# Patient Record
Sex: Male | Born: 1990
Health system: Southern US, Community
[De-identification: ages and names within clinical notes are randomized; demographics above are authoritative.]

## PROBLEM LIST (undated history)

## (undated) DIAGNOSIS — N319 Neuromuscular dysfunction of bladder, unspecified: Secondary | ICD-10-CM

## (undated) DIAGNOSIS — Q059 Spina bifida, unspecified: Secondary | ICD-10-CM

## (undated) HISTORY — DX: Neuromuscular dysfunction of bladder, unspecified: N31.9

## (undated) HISTORY — DX: Spina bifida, unspecified: Q05.9

---
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
## 2012-04-30 DIAGNOSIS — L03119 Cellulitis of unspecified part of limb: Secondary | ICD-10-CM | POA: Diagnosis not present

## 2012-04-30 DIAGNOSIS — L02419 Cutaneous abscess of limb, unspecified: Secondary | ICD-10-CM | POA: Diagnosis not present

## 2012-04-30 DIAGNOSIS — N390 Urinary tract infection, site not specified: Secondary | ICD-10-CM | POA: Diagnosis not present

## 2012-05-11 DIAGNOSIS — J111 Influenza due to unidentified influenza virus with other respiratory manifestations: Secondary | ICD-10-CM | POA: Diagnosis not present

## 2012-05-11 DIAGNOSIS — L02419 Cutaneous abscess of limb, unspecified: Secondary | ICD-10-CM | POA: Diagnosis not present

## 2012-05-11 DIAGNOSIS — L03119 Cellulitis of unspecified part of limb: Secondary | ICD-10-CM | POA: Diagnosis not present

## 2012-09-12 DIAGNOSIS — W57XXXA Bitten or stung by nonvenomous insect and other nonvenomous arthropods, initial encounter: Secondary | ICD-10-CM | POA: Diagnosis not present

## 2012-09-12 DIAGNOSIS — L02419 Cutaneous abscess of limb, unspecified: Secondary | ICD-10-CM | POA: Diagnosis not present

## 2012-09-12 DIAGNOSIS — L03119 Cellulitis of unspecified part of limb: Secondary | ICD-10-CM | POA: Diagnosis not present

## 2012-09-12 DIAGNOSIS — T148 Other injury of unspecified body region: Secondary | ICD-10-CM | POA: Diagnosis not present

## 2012-10-31 DIAGNOSIS — N390 Urinary tract infection, site not specified: Secondary | ICD-10-CM | POA: Diagnosis not present

## 2012-10-31 DIAGNOSIS — L03119 Cellulitis of unspecified part of limb: Secondary | ICD-10-CM | POA: Diagnosis not present

## 2012-10-31 DIAGNOSIS — L02419 Cutaneous abscess of limb, unspecified: Secondary | ICD-10-CM | POA: Diagnosis not present

## 2013-01-02 DIAGNOSIS — L723 Sebaceous cyst: Secondary | ICD-10-CM | POA: Diagnosis not present

## 2013-01-02 DIAGNOSIS — L02419 Cutaneous abscess of limb, unspecified: Secondary | ICD-10-CM | POA: Diagnosis not present

## 2013-01-15 DIAGNOSIS — N5089 Other specified disorders of the male genital organs: Secondary | ICD-10-CM | POA: Insufficient documentation

## 2013-01-15 DIAGNOSIS — N508 Other specified disorders of male genital organs: Secondary | ICD-10-CM | POA: Diagnosis not present

## 2013-02-28 DIAGNOSIS — Q068 Other specified congenital malformations of spinal cord: Secondary | ICD-10-CM | POA: Diagnosis not present

## 2013-02-28 DIAGNOSIS — Q7649 Other congenital malformations of spine, not associated with scoliosis: Secondary | ICD-10-CM | POA: Diagnosis not present

## 2013-03-19 DIAGNOSIS — L02419 Cutaneous abscess of limb, unspecified: Secondary | ICD-10-CM | POA: Diagnosis not present

## 2013-03-19 DIAGNOSIS — N390 Urinary tract infection, site not specified: Secondary | ICD-10-CM | POA: Diagnosis not present

## 2013-04-11 DIAGNOSIS — L02419 Cutaneous abscess of limb, unspecified: Secondary | ICD-10-CM | POA: Diagnosis not present

## 2013-04-11 DIAGNOSIS — N390 Urinary tract infection, site not specified: Secondary | ICD-10-CM | POA: Diagnosis not present

## 2013-07-02 DIAGNOSIS — N390 Urinary tract infection, site not specified: Secondary | ICD-10-CM | POA: Diagnosis not present

## 2013-07-02 DIAGNOSIS — L03119 Cellulitis of unspecified part of limb: Secondary | ICD-10-CM | POA: Diagnosis not present

## 2013-07-02 DIAGNOSIS — L02419 Cutaneous abscess of limb, unspecified: Secondary | ICD-10-CM | POA: Diagnosis not present

## 2013-10-24 DIAGNOSIS — L723 Sebaceous cyst: Secondary | ICD-10-CM | POA: Diagnosis not present

## 2013-10-24 DIAGNOSIS — L02419 Cutaneous abscess of limb, unspecified: Secondary | ICD-10-CM | POA: Diagnosis not present

## 2014-05-19 DIAGNOSIS — Z029 Encounter for administrative examinations, unspecified: Secondary | ICD-10-CM | POA: Diagnosis not present

## 2014-07-04 DIAGNOSIS — S90512A Abrasion, left ankle, initial encounter: Secondary | ICD-10-CM | POA: Diagnosis not present

## 2014-07-04 DIAGNOSIS — N3000 Acute cystitis without hematuria: Secondary | ICD-10-CM | POA: Diagnosis not present

## 2015-07-20 ENCOUNTER — Encounter: Payer: Self-pay | Admitting: Family Medicine

## 2015-07-20 ENCOUNTER — Ambulatory Visit (INDEPENDENT_AMBULATORY_CARE_PROVIDER_SITE_OTHER): Payer: Medicare Other | Admitting: Family Medicine

## 2015-07-20 VITALS — BP 116/68 | HR 84 | Temp 98.5°F | Resp 16 | Wt 127.2 lb

## 2015-07-20 DIAGNOSIS — Q7649 Other congenital malformations of spine, not associated with scoliosis: Secondary | ICD-10-CM | POA: Insufficient documentation

## 2015-07-20 DIAGNOSIS — Q6600 Congenital talipes equinovarus, unspecified foot: Secondary | ICD-10-CM

## 2015-07-20 DIAGNOSIS — Q059 Spina bifida, unspecified: Secondary | ICD-10-CM | POA: Insufficient documentation

## 2015-07-20 DIAGNOSIS — Z0289 Encounter for other administrative examinations: Secondary | ICD-10-CM | POA: Diagnosis not present

## 2015-07-20 DIAGNOSIS — N319 Neuromuscular dysfunction of bladder, unspecified: Secondary | ICD-10-CM | POA: Insufficient documentation

## 2015-07-20 DIAGNOSIS — Q760 Spina bifida occulta: Secondary | ICD-10-CM | POA: Insufficient documentation

## 2015-07-20 NOTE — Patient Instructions (Signed)
Let me know if we missed anything.

## 2015-07-20 NOTE — Progress Notes (Signed)
Patient ID: Jason Marshall, male   DOB: 1990/08/27, 25 y.o.   MRN: [id number] Here for completion of  DMV form to continue driving. He has spina bifida and requires hand controls to drive. Form completed and scanned into the EMR.

## 2015-07-20 NOTE — Progress Notes (Deleted)
Subjective:     Patient ID: Jason Marshall, male   DOB: Oct 14, 1990, 25 y.o.   MRN: [id number]  HPI   Review of Systems     Objective:   Physical Exam     Assessment:     ***    Plan:     ***

## 2015-10-23 ENCOUNTER — Ambulatory Visit (INDEPENDENT_AMBULATORY_CARE_PROVIDER_SITE_OTHER): Payer: Medicare Other | Admitting: Family Medicine

## 2015-10-23 ENCOUNTER — Other Ambulatory Visit: Payer: Self-pay | Admitting: Family Medicine

## 2015-10-23 ENCOUNTER — Encounter: Payer: Self-pay | Admitting: Family Medicine

## 2015-10-23 VITALS — BP 108/62 | HR 88 | Temp 98.0°F | Resp 16 | Wt 133.0 lb

## 2015-10-23 DIAGNOSIS — R300 Dysuria: Secondary | ICD-10-CM | POA: Diagnosis not present

## 2015-10-23 DIAGNOSIS — Z87448 Personal history of other diseases of urinary system: Secondary | ICD-10-CM | POA: Insufficient documentation

## 2015-10-23 DIAGNOSIS — L729 Follicular cyst of the skin and subcutaneous tissue, unspecified: Secondary | ICD-10-CM | POA: Diagnosis not present

## 2015-10-23 DIAGNOSIS — N3090 Cystitis, unspecified without hematuria: Secondary | ICD-10-CM | POA: Diagnosis not present

## 2015-10-23 DIAGNOSIS — Z87718 Personal history of other specified (corrected) congenital malformations of genitourinary system: Secondary | ICD-10-CM | POA: Insufficient documentation

## 2015-10-23 LAB — POCT URINALYSIS DIPSTICK
BILIRUBIN UA: NEGATIVE
Blood, UA: NEGATIVE
GLUCOSE UA: NEGATIVE
Ketones, UA: NEGATIVE
NITRITE UA: POSITIVE
Protein, UA: NEGATIVE
SPEC GRAV UA: 1.01
UROBILINOGEN UA: 0.2
pH, UA: 5

## 2015-10-23 MED ORDER — DOXYCYCLINE HYCLATE 100 MG PO TABS: 100.0000 mg | ORAL_TABLET | Freq: Two times a day (BID) | ORAL | Status: DC

## 2015-10-23 NOTE — Patient Instructions (Signed)
We will call you with the culture results 

## 2015-10-23 NOTE — Progress Notes (Signed)
Subjective:     Patient ID: Jason Marshall, male   DOB: 09-07-1990, 25 y.o.   MRN: [id number]  HPI  Chief Complaint  Patient presents with  . Dysuria    and testicular (right) swelling for 4 days   States he first felt urinary discomfort two days ago. Also reports that he has had a scrotal cyst and that felt tender at first but is no longer. Denies sexually active.   Review of Systems     Objective:   Physical Exam  Constitutional: He appears well-developed and well-nourished. No distress.  Genitourinary:  Right testicle appears atrophied with mobile 1 cm cyst which is non-tender. Prosthetic device descends into his right scrotum.       Assessment:    1. Dysuria - POCT urinalysis dipstick  2. Cystitis - Urine culture - doxycycline (VIBRA-TABS) 100 MG tablet; Take 1 tablet (100 mg total) by mouth 2 (two) times daily.  Dispense: 14 tablet; Refill: 0  3. Scrotal cyst: not inflamed    Plan:    Further f/u pending urine culture. Consider cyst excision if recurrent inflammation.

## 2015-10-26 LAB — URINE CULTURE

## 2015-10-26 LAB — PLEASE NOTE

## 2015-11-06 ENCOUNTER — Other Ambulatory Visit: Payer: Self-pay | Admitting: Family Medicine

## 2015-11-06 ENCOUNTER — Encounter: Payer: Self-pay | Admitting: Family Medicine

## 2015-11-06 ENCOUNTER — Ambulatory Visit (INDEPENDENT_AMBULATORY_CARE_PROVIDER_SITE_OTHER): Payer: Medicare Other | Admitting: Family Medicine

## 2015-11-06 VITALS — BP 112/62 | HR 96 | Temp 99.4°F | Resp 16

## 2015-11-06 DIAGNOSIS — B9620 Unspecified Escherichia coli [E. coli] as the cause of diseases classified elsewhere: Secondary | ICD-10-CM

## 2015-11-06 DIAGNOSIS — N390 Urinary tract infection, site not specified: Secondary | ICD-10-CM

## 2015-11-06 DIAGNOSIS — N3090 Cystitis, unspecified without hematuria: Secondary | ICD-10-CM | POA: Diagnosis not present

## 2015-11-06 LAB — POCT URINALYSIS DIPSTICK
GLUCOSE UA: NEGATIVE
Ketones, UA: NEGATIVE
Nitrite, UA: POSITIVE
SPEC GRAV UA: 1.01
Urobilinogen, UA: 0.2
pH, UA: 6.5

## 2015-11-06 MED ORDER — CIPROFLOXACIN HCL 500 MG PO TABS: 500.0000 mg | ORAL_TABLET | Freq: Two times a day (BID) | ORAL | Status: DC

## 2015-11-06 NOTE — Patient Instructions (Signed)
We will call you with the culture report. 

## 2015-11-06 NOTE — Progress Notes (Signed)
Subjective:     Patient ID: Jason Marshall, male   DOB: [birth year], 25 y.o.   MRN: [id number]  HPI  Chief Complaint  Patient presents with  . Urinary Tract Infection    Was seen on 10/23/2015 for cystitis. Urine cx was positve for E. Coli. Pt finished Doxy. Urinary sx have improved, pt is c/o fever, body aches, H/A, black pain, frequent urination, discolored urine. Denies URI sx.  States he felt ok until he stopped the antibiotic. Prior urine culture reported E. Coli sensitive to all abx.   Review of Systems     Objective:   Physical Exam  Constitutional: He appears well-developed and well-nourished. No distress.  Genitourinary:  No CVA tenderness       Assessment:    1. E. coli UTI - POCT urinalysis dipstick  2. Cystitis - Urine culture - ciprofloxacin (CIPRO) 500 MG tablet; Take 1 tablet (500 mg total) by mouth 2 (two) times daily.  Dispense: 14 tablet; Refill: 0    Plan:    Further f/u pending urine culture.

## 2015-11-08 LAB — URINE CULTURE

## 2015-11-10 ENCOUNTER — Telehealth: Payer: Self-pay

## 2015-11-10 NOTE — Telephone Encounter (Signed)
LMTCB. sd  

## 2015-11-10 NOTE — Telephone Encounter (Signed)
LMTCB

## 2015-11-10 NOTE — Telephone Encounter (Signed)
-----   Message from Anola Gurneyobert Chauvin, [place] sent at 11/10/2015  7:31 AM EDT ----- Continue Cipro for an E.Coli infection

## 2015-11-16 NOTE — Telephone Encounter (Signed)
Patient advised as below.  

## 2016-01-15 ENCOUNTER — Ambulatory Visit
Admission: EM | Admit: 2016-01-15 | Discharge: 2016-01-15 | Disposition: A | Discharge status: Home / Self Care | Payer: Medicare Other | Attending: Emergency Medicine | Admitting: Emergency Medicine

## 2016-01-15 ENCOUNTER — Encounter: Payer: Self-pay | Admitting: *Deleted

## 2016-01-15 DIAGNOSIS — H579 Unspecified disorder of eye and adnexa: Secondary | ICD-10-CM

## 2016-01-15 DIAGNOSIS — H40003 Preglaucoma, unspecified, bilateral: Secondary | ICD-10-CM | POA: Diagnosis not present

## 2016-01-15 DIAGNOSIS — H52223 Regular astigmatism, bilateral: Secondary | ICD-10-CM | POA: Diagnosis not present

## 2016-01-15 DIAGNOSIS — H5203 Hypermetropia, bilateral: Secondary | ICD-10-CM | POA: Diagnosis not present

## 2016-01-15 MED ORDER — KETOTIFEN FUMARATE 0.025 % OP SOLN: 1.0000 [drp] | Freq: Two times a day (BID) | OPHTHALMIC | 0 refills | Status: DC

## 2016-01-15 NOTE — ED Provider Notes (Signed)
CSN: [id number]     Arrival date & time 01/15/16  1134 History   First MD Initiated Contact with Patient 01/15/16 1206     Chief Complaint  Patient presents with  . Eye Pain  . Photophobia  . Blurred Vision   (Consider location/radiation/quality/duration/timing/severity/associated sxs/prior Treatment) HPI  This a 25 year old male presents with a two-week history of waxing and waning bi lateral eye pressure. He does not wear contacts or glasses. He has some photosensitivity no feeling of a foreign body. He does not describe pain but more pressure in his eye. Last night he had more pressure had in the past and has had some blurring of his vision. He denies any discharge or redness. He has a history of a spinal bifida. The patient has an appointment on Monday with [facility] but because of increased pressure and visual disturbances which have since corrected and he came to be seen today.       Past Medical History:  Diagnosis Date  . Neurogenic bladder    with artificial urinary sphincter  . Spina bifida (HCC)    myelodysplasia/sacral agenisis/tethered cord with multiple releases   Past Surgical History:  Procedure Laterality Date  . AMPUTATION  06/11/2009   left fifth toe, metatarsal resection/amputation for osteomylitis  . ANTERIOR AND POSTERIOR SPINAL FUSION  1994   anterior L 3-4 vertebrectomies, anterior and posterior spinal fusion L2-L5, tethered cord release   . artificial urinary sphincter  2010  . BLADDER AUGMENTATION  1995  . Orchidopexy Right 1995   right testicular biopsy  . OSTEOTOMY  1997   right posterior medial release, right foot cuboid closting wedge osteotomy, z-plasty left foot, external rotational osteotomy, left distal tob/fib osteotomy  . TALECTOMY  2001   fibular osteotomy, ilizarov application, excision of distal fibula   Family History  Problem Relation Age of Onset  . Spina bifida Mother    Social History  Substance Use Topics  . Smoking status:  Never Smoker  . Smokeless tobacco: Current User    Types: Snuff  . Alcohol use 0.0 oz/week     Comment: rare    Review of Systems  Constitutional: Negative for activity change, chills, fatigue and fever.  Eyes: Positive for photophobia, pain and visual disturbance.  All other systems reviewed and are negative.   Allergies  Meperidine; Piperacillin-tazobactam in dex; Vancomycin; and Latex  Home Medications   Prior to Admission medications   Medication Sig Start Date End Date Taking? Authorizing Provider  ciprofloxacin (CIPRO) 500 MG tablet Take 1 tablet (500 mg total) by mouth 2 (two) times daily. 11/06/15   Anola Gurney, PA  ketotifen (ZADITOR) 0.025 % ophthalmic solution Place 1 drop into both eyes 2 (two) times daily. 01/15/16   Lutricia Feil, PA-C   Meds Ordered and Administered this Visit  Medications - No data to display  BP 140/84 (BP Location: Left Arm)   Pulse 70   Temp 98.4 F (36.9 C) (Oral)   Resp 16   Ht 4\' 6"  (1.372 m)   Wt 125 lb (56.7 kg)   SpO2 99%   BMI 30.14 kg/m  No data found.   Physical Exam  Constitutional: He is oriented to person, place, and time. He appears well-developed and well-nourished. No distress.  HENT:  Head: Normocephalic and atraumatic.  Eyes: Conjunctivae and EOM are normal. Pupils are equal, round, and reactive to light. Right eye exhibits no discharge. Left eye exhibits no discharge.  Examination of his eyes shows no  erythema present. PERRLA. EOMs are intact and full. No nice tag mass. His upper lids were everted and no findings of no abnormalities or foreign bodies.  Neck: Normal range of motion. Neck supple.  Musculoskeletal:  Patient uses crutches for ambulation from his spinal bifida  Neurological: He is alert and oriented to person, place, and time.  Skin: Skin is warm and dry. He is not diaphoretic.  Psychiatric: He has a normal mood and affect. His behavior is normal. Judgment and thought content normal.  Nursing note  and vitals reviewed.   Urgent Care Course   Clinical Course    Procedures (including critical care time)  Labs Review Labs Reviewed - No data to display  Imaging Review No results found.   Visual Acuity Review  Right Eye Distance: 20/15 Left Eye Distance: 20/15 Bilateral Distance: 20/13  Right Eye Near:   Left Eye Near:    Bilateral Near:         MDM   1. Eye pressure    New Prescriptions   KETOTIFEN (ZADITOR) 0.025 % OPHTHALMIC SOLUTION    Place 1 drop into both eyes 2 (two) times daily.  I told the patient that I don't have adequate instruments to fully examine his eye for pressure. He does not appear to have any emergent abnormality with his eye today and I recommended that he keep his appointment with [facility] on Monday. If he has any change or worsening he should go to the emergency room.    Lutricia FeilWilliam P Shilpa Bushee, PA-C 01/15/16 1300

## 2016-01-15 NOTE — ED Triage Notes (Signed)
Pressure sensation in both eyes, onset 2 weeks ago, intermittent blurred vision in past few days, also pt has photophobia today.

## 2016-03-31 DIAGNOSIS — Q7649 Other congenital malformations of spine, not associated with scoliosis: Secondary | ICD-10-CM | POA: Diagnosis not present

## 2016-03-31 DIAGNOSIS — Q068 Other specified congenital malformations of spinal cord: Secondary | ICD-10-CM | POA: Diagnosis not present

## 2016-04-26 ENCOUNTER — Ambulatory Visit (INDEPENDENT_AMBULATORY_CARE_PROVIDER_SITE_OTHER): Payer: Medicare Other | Admitting: Family Medicine

## 2016-04-26 ENCOUNTER — Ambulatory Visit: Payer: Self-pay | Admitting: Family Medicine

## 2016-04-26 ENCOUNTER — Encounter: Payer: Self-pay | Admitting: Family Medicine

## 2016-04-26 VITALS — BP 126/58 | HR 73 | Temp 98.1°F | Resp 16 | Wt 133.8 lb

## 2016-04-26 DIAGNOSIS — R221 Localized swelling, mass and lump, neck: Secondary | ICD-10-CM | POA: Diagnosis not present

## 2016-04-26 NOTE — Progress Notes (Signed)
Subjective:     Patient ID: Jason Marshall, male   DOB: Jan 21, 1991, 25 y.o.   MRN: [id number]  HPI  Chief Complaint  Patient presents with  . Allergic Reaction    Patient comes in office today wit address possible allergic reaction, patient states that [place] and on 12/22 patient had eaten two bits of shrimp when he reports that he felt swelling of his throat. Patients mother who is accompanied with him today states that they did give patient benadryl. Mother reports that patient has felt this before over the past few months but cannot recall if it was after eating shrimp. Patient states that on 12/25 he felt like he still had feeling  Reports he feels better today. Accompanied by his mother. Previous episodes have felt like a "ball" in his throat but denies significant stress at this time. Mom reports paternal relatives with seafood allergies.   Review of Systems     Objective:   Physical Exam  Constitutional: He appears well-developed and well-nourished. No distress.  HENT:  Mouth/Throat: Oropharynx is clear and moist.  Pulmonary/Chest: Breath sounds normal.  Psychiatric: He has a normal mood and affect. His behavior is normal.       Assessment:    1. Throat swelling - Ambulatory referral to Allergy    Plan:    Discussed keeping Claritin and Benadryl with him pending allergist evaluation.

## 2016-04-26 NOTE — Patient Instructions (Signed)
We will call you with the referral information. 

## 2016-06-01 ENCOUNTER — Encounter: Payer: Self-pay | Admitting: Family Medicine

## 2016-06-01 ENCOUNTER — Ambulatory Visit (INDEPENDENT_AMBULATORY_CARE_PROVIDER_SITE_OTHER): Payer: Medicare Other | Admitting: Family Medicine

## 2016-06-01 VITALS — BP 112/78 | HR 85 | Temp 98.2°F | Resp 16 | Wt 136.0 lb

## 2016-06-01 DIAGNOSIS — N3090 Cystitis, unspecified without hematuria: Secondary | ICD-10-CM | POA: Diagnosis not present

## 2016-06-01 LAB — POCT URINALYSIS DIPSTICK
Glucose, UA: NEGATIVE
KETONES UA: NEGATIVE
Nitrite, UA: POSITIVE
PROTEIN UA: 30
Spec Grav, UA: <=1.005
Urobilinogen, UA: 0.2
pH, UA: 7

## 2016-06-01 MED ORDER — SULFAMETHOXAZOLE-TRIMETHOPRIM 800-160 MG PO TABS: 1.0000 | ORAL_TABLET | Freq: Two times a day (BID) | ORAL | 0 refills | Status: DC

## 2016-06-01 NOTE — Progress Notes (Signed)
Subjective:     Patient ID: Jason Marshall, male   DOB: [birth year], 26 y.o.   MRN: [id number]  HPI  Chief Complaint  Patient presents with  . Dysuria    Patient comes in office today with concerns of urinary tract infection, patient reports dysuria for the past 2 days or more. Patient also states that he has been dealing with fatigue for two weeks and was unsure if this was related.   No fever or chills reported.   Review of Systems  Allergic/Immunologic:       Pending allergist evaluation next month for possible shellfish allergy       Objective:   Physical Exam  Constitutional: He appears well-developed and well-nourished. No distress.       Assessment:    1. Cystitis - Urine culture - POCT urinalysis dipstick - sulfamethoxazole-trimethoprim (BACTRIM DS,SEPTRA DS) 800-160 MG tablet; Take 1 tablet by mouth 2 (two) times daily. May stop after 3 days if better  Dispense: 14 tablet; Refill: 0    Plan:    Further f/u pending culture report.

## 2016-06-01 NOTE — Patient Instructions (Signed)
We will call you with the culture report. 

## 2016-06-03 LAB — URINE CULTURE

## 2016-06-06 ENCOUNTER — Telehealth: Payer: Self-pay

## 2016-06-06 NOTE — Telephone Encounter (Signed)
Advised pt of lab results. Pt verbally acknowledges understanding. Jason Marshall, CMA   

## 2016-06-06 NOTE — Telephone Encounter (Signed)
-----   Message from Anola Gurneyobert Chauvin, [place] sent at 06/06/2016  7:40 AM EST ----- Continue Sulfa antibiotic for  E. Coli infection.

## 2016-06-07 DIAGNOSIS — J301 Allergic rhinitis due to pollen: Secondary | ICD-10-CM | POA: Diagnosis not present

## 2016-06-07 DIAGNOSIS — J3081 Allergic rhinitis due to animal (cat) (dog) hair and dander: Secondary | ICD-10-CM | POA: Diagnosis not present

## 2016-06-07 DIAGNOSIS — J3089 Other allergic rhinitis: Secondary | ICD-10-CM | POA: Diagnosis not present

## 2016-06-07 DIAGNOSIS — Z91018 Allergy to other foods: Secondary | ICD-10-CM | POA: Diagnosis not present

## 2016-06-10 ENCOUNTER — Ambulatory Visit (INDEPENDENT_AMBULATORY_CARE_PROVIDER_SITE_OTHER): Payer: Medicare Other | Admitting: Family Medicine

## 2016-06-10 ENCOUNTER — Encounter: Payer: Self-pay | Admitting: Family Medicine

## 2016-06-10 VITALS — BP 140/62 | HR 88 | Temp 98.4°F | Resp 16 | Wt 139.0 lb

## 2016-06-10 DIAGNOSIS — R42 Dizziness and giddiness: Secondary | ICD-10-CM

## 2016-06-10 DIAGNOSIS — R002 Palpitations: Secondary | ICD-10-CM | POA: Diagnosis not present

## 2016-06-10 DIAGNOSIS — R0602 Shortness of breath: Secondary | ICD-10-CM | POA: Diagnosis not present

## 2016-06-10 LAB — EKG 12-LEAD

## 2016-06-10 NOTE — Progress Notes (Signed)
Subjective:     Patient ID: Jason Marshall, male   DOB: [birth year], 26 y.o.   MRN: [id number]  HPI  Chief Complaint  Patient presents with  . Dizziness    Patient comes in office today with concerns of dizziness for the past 3 weeks. Patient reports episodes of dizziness usually being in AM upon awakening. Patient reports that he feels light headed, experiences shakes and feeling out of breath. Associated with dizziness patient states that he always gets a headache on the top of his head on the right side.   Reports the episodes will last from 1-1.5 hours and occur nearly daily. He states he will stop and rest for a while and they will  abate. Can occur with exertion and occur both before and after lunch. Occasionally will feel his heart may be racing;no vertigo. Usually skips breakfast but has a mid-morning snack. Mom reports that there is a strong family history of hypoglycemic episodes.   Review of Systems     Objective:   Physical Exam  Constitutional: He appears well-developed and well-nourished. No distress.  Eyes: EOM are normal. Pupils are equal, round, and reactive to light.  Cardiovascular: Normal rate and regular rhythm.   Pulmonary/Chest: Breath sounds normal.  Musculoskeletal:  Grip strength 5/5 symmetrically  Neurological: Coordination (finger to nose WNL) normal.       Assessment:    1. Dizziness - Comprehensive metabolic panel - EKG 12-Lead - CBC with Differential/Platelet  2. Shortness of breath - EKG 12-Lead - CBC with Differential/Platelet  3. Palpitations - T4, free - TSH    Plan:    Provided with hypoglycemic food sheet pending lab work. Consider Holter monitor.

## 2016-06-10 NOTE — Patient Instructions (Signed)
We will call you with the lab results. Try to eat a breakfast or snack first thing in the AM from the food list provided.

## 2016-06-13 DIAGNOSIS — R002 Palpitations: Secondary | ICD-10-CM | POA: Diagnosis not present

## 2016-06-13 DIAGNOSIS — R0602 Shortness of breath: Secondary | ICD-10-CM | POA: Diagnosis not present

## 2016-06-13 DIAGNOSIS — R42 Dizziness and giddiness: Secondary | ICD-10-CM | POA: Diagnosis not present

## 2016-06-14 LAB — COMPREHENSIVE METABOLIC PANEL
A/G RATIO: 1.9 (ref 1.2–2.2)
ALK PHOS: 67 IU/L (ref 39–117)
ALT: 22 IU/L (ref 0–44)
AST: 19 IU/L (ref 0–40)
Albumin: 4.7 g/dL (ref 3.5–5.5)
BUN/Creatinine Ratio: 19 (ref 9–20)
BUN: 14 mg/dL (ref 6–20)
Bilirubin Total: 0.6 mg/dL (ref 0.0–1.2)
CALCIUM: 9.6 mg/dL (ref 8.7–10.2)
CHLORIDE: 104 mmol/L (ref 96–106)
CO2: 24 mmol/L (ref 18–29)
Creatinine, Ser: 0.74 mg/dL — ABNORMAL LOW (ref 0.76–1.27)
GFR calc Af Amer: 149 mL/min/{1.73_m2} (ref >59)
GFR, EST NON AFRICAN AMERICAN: 129 mL/min/{1.73_m2} (ref >59)
Globulin, Total: 2.5 g/dL (ref 1.5–4.5)
Glucose: 108 mg/dL — ABNORMAL HIGH (ref 65–99)
POTASSIUM: 4.4 mmol/L (ref 3.5–5.2)
Sodium: 142 mmol/L (ref 134–144)
Total Protein: 7.2 g/dL (ref 6.0–8.5)

## 2016-06-14 LAB — CBC WITH DIFFERENTIAL/PLATELET
BASOS ABS: 0 10*3/uL (ref 0.0–0.2)
Basos: 0 %
EOS (ABSOLUTE): 0 10*3/uL (ref 0.0–0.4)
Eos: 1 %
Hematocrit: 42.8 % (ref 37.5–51.0)
Hemoglobin: 14.1 g/dL (ref 13.0–17.7)
IMMATURE GRANS (ABS): 0 10*3/uL (ref 0.0–0.1)
Immature Granulocytes: 0 %
LYMPHS: 34 %
Lymphocytes Absolute: 1.7 10*3/uL (ref 0.7–3.1)
MCH: 29.4 pg (ref 26.6–33.0)
MCHC: 32.9 g/dL (ref 31.5–35.7)
MCV: 89 fL (ref 79–97)
MONOS ABS: 0.5 10*3/uL (ref 0.1–0.9)
Monocytes: 10 %
NEUTROS ABS: 2.9 10*3/uL (ref 1.4–7.0)
Neutrophils: 55 %
PLATELETS: 186 10*3/uL (ref 150–379)
RBC: 4.8 x10E6/uL (ref 4.14–5.80)
RDW: 13.7 % (ref 12.3–15.4)
WBC: 5.2 10*3/uL (ref 3.4–10.8)

## 2016-06-14 LAB — T4, FREE: FREE T4: 1.1 ng/dL (ref 0.82–1.77)

## 2016-06-14 LAB — TSH: TSH: 0.259 u[IU]/mL — AB (ref 0.450–4.500)

## 2016-06-22 ENCOUNTER — Encounter: Payer: Self-pay | Admitting: Family Medicine

## 2016-06-22 ENCOUNTER — Ambulatory Visit (INDEPENDENT_AMBULATORY_CARE_PROVIDER_SITE_OTHER): Payer: Medicare Other | Admitting: Family Medicine

## 2016-06-22 VITALS — BP 110/60 | HR 85 | Temp 98.9°F | Resp 16 | Wt 133.6 lb

## 2016-06-22 DIAGNOSIS — N453 Epididymo-orchitis: Secondary | ICD-10-CM | POA: Diagnosis not present

## 2016-06-22 LAB — POCT URINALYSIS DIPSTICK
BILIRUBIN UA: NEGATIVE
Glucose, UA: NEGATIVE
KETONES UA: NEGATIVE
Nitrite, UA: POSITIVE
Spec Grav, UA: <=1.005
Urobilinogen, UA: 0.2
pH, UA: 7

## 2016-06-22 MED ORDER — CIPROFLOXACIN HCL 500 MG PO TABS: 500.0000 mg | ORAL_TABLET | Freq: Two times a day (BID) | ORAL | 0 refills | Status: DC

## 2016-06-22 NOTE — Patient Instructions (Addendum)
Cold compresses and ibuprofen may help with the swelling. If not improving over the next 48 hours call for urology referral. Orchitis Introduction Orchitis is swelling (inflammation) of a testicle caused by infection. Testicles are the male organs that produce sperm. The testicles are held in a fleshy sac (scrotum) located behind the penis. Orchitis usually affects only one testicle, but it can occur in both. The condition can develop suddenly. Orchitis can be caused by many different kinds of bacteria and viruses. What are the causes? Orchitis can be caused by either a bacterial or viral infection. Bacterial Infections  These often occur along with an infection of the coiled tube that collects sperm and sits on top of the testicle (epididymis).  In men who are not sexually active, bacterial orchitis usually starts as a urinary tract infection and spreads to the testicle.  In sexually active men, sexually transmitted infections are the most common cause of bacterial orchitis. These can include:  Gonorrhea.  Chlamydia. Viral Infections  Mumps is still the most common cause of viral orchitis, though mumps is now rare in many areas because of vaccination.  Other viruses that can cause orchitis include:  The chickenpox virus (varicella-zoster virus).  The virus that causes mononucleosis (Epstein-Barr virus). What increases the risk? Boys and men who have not been vaccinated against mumps are at risk for mumps orchitis. Risk factors for bacterial orchitis include:  Frequent urinary tract infections.  High-risk sexual behaviors.  Having a sexual partner with a sexually transmitted infection.  Having had urinary tract surgery.  Using a tube passed through the penis to drain urine (Foley catheter).  An enlarged prostate gland. What are the signs or symptoms? The most common symptoms of orchitis are swelling and pain in the scrotum. Other signs and symptoms may include:  Feeling  generally sick (malaise).  Fever and chills.  Painful urination.  Painful ejaculation.  Blood or discharge from the penis.  Nausea.  Headache.  Fatigue. How is this diagnosed? Your health care provider may suspect orchitis if you have a painful, swollen testicle along with other signs and symptoms of the condition. A physical exam will be done. Tests may also be done to help your health care provider make a diagnosis. These may include:  A blood test to check for signs of infection.  A urine test to check for a urinary tract infection.  Using a swab to collect a fluid sample from the tip of the penis to test for sexually transmitted infections.  Taking an image of the testicle using sound waves and a computer (testicular ultrasound). How is this treated? Treatment of orchitis depends on the cause. For orchitis caused by a bacterial infection, your health care provider will most likely prescribe antibiotic medicines. Bacterial infections usually clear up within a few days. Both viral infections and bacterial infections may be treated with:  Bed rest.  Anti-inflammatory medicines.  Pain medicines.  Elevating the scrotum and applying ice. Follow these instructions at home:  Rest as directed by your health care provider.  Take medicines only as directed by your health care provider.  If you were prescribed an antibiotic medicine, finish it all even if you start to feel better.  Elevate your scrotum and apply ice as directed:  Put ice in a plastic bag.  Place a small towel or pillow between your legs.  Rest your scrotum on the pillow or towel.  Place another towel between your skin and the plastic bag.  Leave the ice  on for 20 minutes, 2-3 times a day. Contact a health care provider if:  You have a fever.  Pain and swelling have not gotten better after 3 days. Get help right away if:  Your pain is getting worse.  The swelling in your testicle gets  worse. This information is not intended to replace advice given to you by your health care provider. Make sure you discuss any questions you have with your health care provider. Document Released: 04/15/2000 Document Revised: 09/24/2015 Document Reviewed: 09/05/2013  2017 [employer]vier

## 2016-06-22 NOTE — Progress Notes (Signed)
Subjective:     Patient ID: Jason Marshall, male   DOB: 1990/12/30, 26 y.o.   MRN: [id number]  HPI  Chief Complaint  Patient presents with  . Testicle Pain    Patient comes in office today with concerns of swelling to the right testicle, patient reports that he noticed swelling when waking up this morning. Patient states that testicle is painful to the touch, he denies injury to area.   States he has had what sounds like orchitis in the past treated with antibiotics. Also has a right scrotal cyst. Not sexually active at this time.   Review of Systems     Objective:   Physical Exam  Constitutional: He appears well-developed and well-nourished. No distress.  Genitourinary:  Genitourinary Comments: Mild scrotal tenderness and enlargement. Small scrotal cyst and prosthetic device palpated. No testicle mass.       Assessment:    1. Orchitis and epididymitis - POCT urinalysis dipstick - ciprofloxacin (CIPRO) 500 MG tablet; Take 1 tablet (500 mg total) by mouth 2 (two) times daily.  Dispense: 20 tablet; Refill: 0 - Urine culture    Plan:    Further f/u pending urine culture.Consider urology referral if not improving.

## 2016-06-24 ENCOUNTER — Telehealth: Payer: Self-pay

## 2016-06-24 LAB — URINE CULTURE

## 2016-06-24 NOTE — Telephone Encounter (Signed)
-----   Message from Anola Gurneyobert Chauvin, [place] sent at 06/24/2016  1:55 PM EST ----- You have an E.Coli infection sensitive to Cipro. Is your testicle feeling better.

## 2016-06-24 NOTE — Telephone Encounter (Signed)
Left message to call back  

## 2016-06-24 NOTE — Telephone Encounter (Signed)
Pt advised.   Thanks,   -Meloni Hinz  

## 2016-12-16 ENCOUNTER — Encounter: Payer: Self-pay | Admitting: Family Medicine

## 2016-12-16 ENCOUNTER — Ambulatory Visit (INDEPENDENT_AMBULATORY_CARE_PROVIDER_SITE_OTHER): Payer: Medicare Other | Admitting: Family Medicine

## 2016-12-16 VITALS — BP 106/72 | HR 83 | Temp 98.6°F | Resp 16 | Wt 134.0 lb

## 2016-12-16 DIAGNOSIS — N3090 Cystitis, unspecified without hematuria: Secondary | ICD-10-CM

## 2016-12-16 LAB — POCT URINALYSIS DIPSTICK
Glucose, UA: NEGATIVE
Ketones, UA: NEGATIVE
NITRITE UA: POSITIVE
PH UA: 8 (ref 5.0–8.0)
Spec Grav, UA: <=1.005 — AB (ref 1.010–1.025)
UROBILINOGEN UA: 1 U/dL

## 2016-12-16 MED ORDER — CEPHALEXIN 500 MG PO CAPS: 500.0000 mg | ORAL_CAPSULE | Freq: Two times a day (BID) | ORAL | 0 refills | Status: DC

## 2016-12-16 NOTE — Patient Instructions (Signed)
We will call you with the culture results 

## 2016-12-16 NOTE — Progress Notes (Signed)
Subjective:     Patient ID: Jason Marshall, male   DOB: January 18, 1991, 26 y.o.   MRN: [id number]  HPI  Chief Complaint  Patient presents with  . Dysuria    Patient comes in office today with complaints of burning with urination for the past 5 days.   Last urine culture resistant to doxycycline and sulfa. Hx of spina bifida with urinary catherization.   Review of Systems  Constitutional: Negative for chills and fever.       Objective:   Physical Exam  Constitutional: He appears well-developed and well-nourished. No distress.       Assessment:    1. Cystitis - POCT urinalysis dipstick - Urine Culture - cephALEXin (KEFLEX) 500 MG capsule; Take 1 capsule (500 mg total) by mouth 2 (two) times daily.  Dispense: 14 capsule; Refill: 0    Plan:    Further f/u pending urine culture results.

## 2016-12-19 LAB — URINE CULTURE

## 2017-11-14 ENCOUNTER — Ambulatory Visit (INDEPENDENT_AMBULATORY_CARE_PROVIDER_SITE_OTHER): Payer: Medicare Other | Admitting: Family Medicine

## 2017-11-14 ENCOUNTER — Encounter: Payer: Self-pay | Admitting: Family Medicine

## 2017-11-14 VITALS — BP 110/60 | HR 72 | Temp 98.4°F | Resp 16 | Wt 133.0 lb

## 2017-11-14 DIAGNOSIS — W57XXXA Bitten or stung by nonvenomous insect and other nonvenomous arthropods, initial encounter: Secondary | ICD-10-CM | POA: Diagnosis not present

## 2017-11-14 DIAGNOSIS — G4452 New daily persistent headache (NDPH): Secondary | ICD-10-CM | POA: Diagnosis not present

## 2017-11-14 DIAGNOSIS — S30860A Insect bite (nonvenomous) of lower back and pelvis, initial encounter: Secondary | ICD-10-CM

## 2017-11-14 MED ORDER — DOXYCYCLINE HYCLATE 100 MG PO TABS: 100.0000 mg | ORAL_TABLET | Freq: Two times a day (BID) | ORAL | 1 refills | Status: DC

## 2017-11-14 NOTE — Patient Instructions (Signed)
Let me know if you are not getting better or new symptoms.

## 2017-11-14 NOTE — Progress Notes (Signed)
  Subjective:     Patient ID: Jason Marshall, male   DOB: 08/20/1990, 27 y.o.   MRN: [id number] Chief Complaint  Patient presents with  . Headache    Patient  comes in office today with complaint of headache for the past 6 days that he describes as throbbing and located around his entire head and behind right eye. Patient states that he ran a fever for about 4-5 days between 100-101, associated with fever patient reports nausea, bodyaches, chills and weakness. Patient states that he has concerns of tick fever.    HPI States he has pulled off multiple ticks this season. Reports body aches seem improved but headache persists. States he is now married. Works in [employer] with his family members and has a [employer] business.  Review of Systems     Objective:   Physical Exam  Constitutional: He appears well-developed and well-nourished. No distress.  Skin:  Left upper back with papule c/w insect bite. No surrounding erythema noted.  Ears: T.M's intact without inflammation Throat: no tonsillar enlargement or exudate Neck: no cervical adenopathy Lungs: clear     Assessment:    1. Tick bite of back, initial encounter  2. New daily persistent headache: cover for tick fever with doxycycline    Plan:    Further f/u if not improving.

## 2018-08-29 ENCOUNTER — Encounter: Payer: Self-pay | Admitting: Physician Assistant

## 2018-08-29 ENCOUNTER — Other Ambulatory Visit: Payer: Self-pay

## 2018-08-29 ENCOUNTER — Ambulatory Visit (INDEPENDENT_AMBULATORY_CARE_PROVIDER_SITE_OTHER): Payer: Medicare HMO | Admitting: Physician Assistant

## 2018-08-29 VITALS — BP 144/98 | HR 80 | Temp 98.5°F | Resp 16 | Wt 134.0 lb

## 2018-08-29 DIAGNOSIS — N390 Urinary tract infection, site not specified: Secondary | ICD-10-CM

## 2018-08-29 DIAGNOSIS — Q057 Lumbar spina bifida without hydrocephalus: Secondary | ICD-10-CM

## 2018-08-29 DIAGNOSIS — Z87448 Personal history of other diseases of urinary system: Secondary | ICD-10-CM | POA: Diagnosis not present

## 2018-08-29 DIAGNOSIS — R300 Dysuria: Secondary | ICD-10-CM

## 2018-08-29 DIAGNOSIS — Z789 Other specified health status: Secondary | ICD-10-CM

## 2018-08-29 DIAGNOSIS — N319 Neuromuscular dysfunction of bladder, unspecified: Secondary | ICD-10-CM

## 2018-08-29 DIAGNOSIS — N3090 Cystitis, unspecified without hematuria: Secondary | ICD-10-CM | POA: Diagnosis not present

## 2018-08-29 LAB — POCT URINALYSIS DIPSTICK
Glucose, UA: NEGATIVE
Ketones, UA: NEGATIVE
Leukocytes, UA: NEGATIVE
Nitrite, UA: POSITIVE
Protein, UA: POSITIVE — AB
Spec Grav, UA: 1.015 (ref 1.010–1.025)
Urobilinogen, UA: 0.2 E.U./dL
pH, UA: 6 (ref 5.0–8.0)

## 2018-08-29 MED ORDER — CEPHALEXIN 500 MG PO CAPS: 500.0000 mg | ORAL_CAPSULE | Freq: Two times a day (BID) | ORAL | 0 refills | Status: AC

## 2018-08-29 NOTE — Patient Instructions (Signed)

## 2018-08-29 NOTE — Progress Notes (Addendum)
Patient: Jason Marshall Male    DOB: Sep 03, 1990   28 y.o.   MRN: [id number] Visit Date: 09/05/2018  Today's Provider: Trey Sailors, PA-C   Chief Complaint  Patient presents with  . Cystitis   Subjective:     HPI   Patient is having burning with urination, urgency, and pus. Symptoms ongoing for 1 week. He has a history of recurrent UTIs. He denies penile discharge or new sexual partner. Denies fevers, chills, nausea, vomiting.  He has a history of sacral agenesis and requires in and out catheterization three times daily for neurogenic bladder. He reports he use a 14 french catheter. He says he has not had an Rx for catheters since he was a young child and thus pays for the catheters out of pocket at a local medical store. He buys a box of 30 and then will reuse the same catheter over a period of a week to conserve supplies.  He also buys crutches on [place] once every four months out of pocket as well. Says he used to get it at a supply store which has since gone out of business and hasn't been able to get them in person in any other store. He is not sure if they are able to be ordered but he never feels like waiting that long and will order it off [employer] at his own expense.     Allergies  Allergen Reactions  . Meperidine Swelling    Other reaction(s): Unknown  . Shrimp [Shellfish Allergy] Anaphylaxis  . Piperacillin-Tazobactam In Dex Other (See Comments)    Other Reaction: AIN w/ Vanc/Zosyn - ? which  . Vancomycin Other (See Comments)    Other Reaction: AIN w/ Vanc/Zosyn - ? which  . Latex     Other reaction(s): Unknown     Current Outpatient Medications:  .  EPIPEN 2-PAK 0.3 MG/0.3ML SOAJ injection, , Disp: , Rfl:  .  levocetirizine (XYZAL) 5 MG tablet, , Disp: , Rfl:  .  Azelastine HCl 0.15 % SOLN, , Disp: , Rfl:  .  cephALEXin (KEFLEX) 500 MG capsule, Take 1 capsule (500 mg total) by mouth 2 (two) times daily for 7 days., Disp: 14 capsule, Rfl: 0 .   doxycycline (VIBRA-TABS) 100 MG tablet, Take 1 tablet (100 mg total) by mouth 2 (two) times daily. (Patient not taking: Reported on 08/29/2018), Disp: 14 tablet, Rfl: 1 .  fluticasone (FLONASE) 50 MCG/ACT nasal spray, , Disp: , Rfl:   Review of Systems  Genitourinary: Positive for discharge, frequency and urgency.  All other systems reviewed and are negative.   Social History   Tobacco Use  . Smoking status: Never Smoker  . Smokeless tobacco: Current User    Types: Snuff  Substance Use Topics  . Alcohol use: Yes    Alcohol/week: 0.0 standard drinks    Comment: rare      Objective:   BP (!) 144/98 (BP Location: Left Arm, Patient Position: Sitting, Cuff Size: Normal)   Pulse 80   Temp 98.5 F (36.9 C) (Oral)   Resp 16   Wt 134 lb (60.8 kg)   BMI 32.31 kg/m  Vitals:   08/29/18 1502  BP: (!) 144/98  Pulse: 80  Resp: 16  Temp: 98.5 F (36.9 C)  TempSrc: Oral  Weight: 134 lb (60.8 kg)     Physical Exam Constitutional:      Appearance: Normal appearance.  Cardiovascular:     Rate and Rhythm: Normal  rate and regular rhythm.     Heart sounds: Normal heart sounds.  Pulmonary:     Breath sounds: Normal breath sounds.  Abdominal:     General: Abdomen is flat.     Palpations: Abdomen is soft.     Tenderness: There is no abdominal tenderness.  Neurological:     Mental Status: He is alert.  Psychiatric:        Mood and Affect: Mood normal.        Behavior: Behavior normal.         Assessment & Plan    1. Recurrent UTI  Patient with history of recurrent UTI that is MDR on previous cultures. Will treat as below based on prior sensitivities. Counseled that patient is certainly getting UTI from reusing catheters. Will place order for catheters so that they will be covered by insurance and he can use a new, sterile catheter for each event.  - cephALEXin (KEFLEX) 500 MG capsule; Take 1 capsule (500 mg total) by mouth 2 (two) times daily for 7 days.  Dispense: 14  capsule; Refill: 0 - Urine Culture  2. Burning with urination  - POCT urinalysis dipstick - cephALEXin (KEFLEX) 500 MG capsule; Take 1 capsule (500 mg total) by mouth 2 (two) times daily for 7 days.  Dispense: 14 capsule; Refill: 0 - Urine Culture  3. History of urinary self-catheterization   4. Spina bifida of lumbar region without hydrocephalus [facility](H[facility])  Has appointment with neurosurgery tomorrow, needs to ask about Rx for crutches.   5. Neurogenic bladder   The entirety of the information documented in the History of Present Illness, Review of Systems and Physical Exam were personally obtained by me. Portions of this information were initially documented by Lexine BatonStacy Baldwin, LPN and reviewed by me for thoroughness and accuracy.   F/u PRN.         Trey SailorsAdriana M Pollak, PA-C  [facility]

## 2018-08-30 DIAGNOSIS — Q068 Other specified congenital malformations of spinal cord: Secondary | ICD-10-CM | POA: Diagnosis not present

## 2018-08-30 DIAGNOSIS — Q7649 Other congenital malformations of spine, not associated with scoliosis: Secondary | ICD-10-CM | POA: Diagnosis not present

## 2018-08-30 NOTE — Addendum Note (Signed)
Addended by: Trey Sailors on: 08/30/2018 11:46 AM   Modules accepted: Orders

## 2018-08-31 ENCOUNTER — Ambulatory Visit: Payer: Self-pay

## 2018-08-31 DIAGNOSIS — Q057 Lumbar spina bifida without hydrocephalus: Secondary | ICD-10-CM

## 2018-08-31 DIAGNOSIS — N319 Neuromuscular dysfunction of bladder, unspecified: Secondary | ICD-10-CM

## 2018-08-31 LAB — URINE CULTURE

## 2018-08-31 NOTE — Patient Instructions (Addendum)
  Thank you allowing the Chronic Care Management Team to be a part of your care! It was a pleasure speaking with you today!  1. Please utilize [employer] OTC benefits for all your over the counter needs  CCM (Chronic Care Management) Team   Trish Fountain RN, BSN Nurse Care Coordinator  [phone]  Ruben Reason PharmD  Clinical Pharmacist  [phone]   Elliot Gurney, LCSW Clinical Social Worker [phone]  Goals Addressed            This Visit's Progress   . It was such a struggle after I turned 19 to get things covered, I started just paying out of pocket (pt-stated)       Current Barriers:  Marland Kitchen Knowledge Deficits related to understanding [employer] OTC benefits  Nurse Case Manager Clinical Goal(s):  Marland Kitchen Over the next 7 days, patient will verbalize receipt and use of [employer] OTC benefits catalogue  Interventions:  . Provided patient with contact information for [employer] OTC . Encouraged patient utilize OTC benefits as he has 300.00/quarter  Patient Self Care Activities:  Utilize Catalogue  Initial goal documentation       Mr. Cabiness was given information about Chronic Care Management services today including:  1. CCM service includes personalized support from designated clinical staff supervised by his physician, including individualized plan of care and coordination with other care providers 2. 24/7 contact phone numbers for assistance for urgent and routine care needs. 3. Service will only be billed when office clinical staff spend 20 minutes or more in a month to coordinate care. 4. Only one practitioner may furnish and bill the service in a calendar month. 5. The patient may stop CCM services at any time (effective at the end of the month) by phone call to the office staff. 6. The patient will be responsible for cost sharing (co-pay) of up to 20% of the service fee (after annual deductible is met).  Patient agreed to services and verbal consent obtained.   Print  copy of patient instructions provided. via email  Telephone follow up appointment with CCM team member scheduled for: 09/04/2018 at 1:00

## 2018-08-31 NOTE — Chronic Care Management (AMB) (Signed)
  Chronic Care Management   Note  08/31/2018 Name: JOSEPH BIAS MRN: [id number] DOB: [birth year]  Desmond Szabo is a 28 year old male who sees Carles Collet, [place] for primary care. Ms. Terrilee Croak asked the CCM team to consult the patient for chronic care management and care coordination related to the need for I and O cath supplies. Patient has a history of but not limited to Spina Bifida and Neurogenic bladder. Referral was placed 08/30/2018. Patient's last office visit was 08/29/2018.  Telephone outreach to patient today to introduce CCM services.  SDOH (Social Determinants of Health) screening performed today. See Care Plan Entry related to challenges with: None   Goals Addressed            This Visit's Progress   . It was such a struggle after I turned 19 to get things covered, I started just paying out of pocket (pt-stated)       Current Barriers:  Marland Kitchen Knowledge Deficits related to understanding [employer] OTC benefits  Nurse Case Manager Clinical Goal(s):  Marland Kitchen Over the next 7 days, patient will verbalize receipt and use of [employer] OTC benefits catalogue  Interventions:  . Provided patient with contact information for [employer] OTC . Encouraged patient utilize OTC benefits as he has 300.00/quarter  Patient Self Care Activities:  Utilize Catalogue  Initial goal documentation        Mr. Pewitt was given information about Chronic Care Management services today including:  1. CCM service includes personalized support from designated clinical staff supervised by his physician, including individualized plan of care and coordination with other care providers 2. 24/7 contact phone numbers for assistance for urgent and routine care needs. 3. Service will only be billed when office clinical staff spend 20 minutes or more in a month to coordinate care. 4. Only one practitioner may furnish and bill the service in a calendar month. 5. The patient may stop CCM services at any time (effective at  the end of the month) by phone call to the office staff. 6. The patient will be responsible for cost sharing (co-pay) of up to 20% of the service fee (after annual deductible is met).  Patient agreed to services and verbal consent obtained.      Plan: CCM RN CM initial telephone assessment scheduled for 09/04/2018 at 1:00  Niaja Stickley E. Rollene Rotunda, RN, BSN Nurse Care Coordinator [facility] Care Management [phone]

## 2018-09-03 ENCOUNTER — Telehealth: Payer: Self-pay

## 2018-09-03 NOTE — Telephone Encounter (Signed)
Patient was advised and states that the Keflex helped out a lot.

## 2018-09-03 NOTE — Telephone Encounter (Signed)
-----   Message from Trey Sailors, [place] sent at 09/03/2018  1:38 PM EDT ----- Urine culture has come back positive for E. Coli sensitive to keflex. How is he doing? I have replaced referral for care management to help insurance pay for catheters.

## 2018-09-04 ENCOUNTER — Other Ambulatory Visit: Payer: Self-pay

## 2018-09-04 ENCOUNTER — Ambulatory Visit: Payer: Self-pay

## 2018-09-04 DIAGNOSIS — N319 Neuromuscular dysfunction of bladder, unspecified: Secondary | ICD-10-CM

## 2018-09-04 DIAGNOSIS — Q057 Lumbar spina bifida without hydrocephalus: Secondary | ICD-10-CM

## 2018-09-04 NOTE — Chronic Care Management (AMB) (Signed)
Chronic Care Management   Initial Visit Note  09/04/2018 Name: Jason Marshall MRN: [id number] DOB: [birth year]  Subjective: "I have been using the same cath for a week at a time and paying for them out of pocket"  Objective:  Assessment: Jason Marshall is a 28 year old malewho sees Carles Collet, [place] for primary care. Jason Marshall the CCM team to consult the patient for chronic care management and care coordination related to the need for I and O cath supplies. Patient has a history of but not limited to Spina Bifida and Neurogenic bladder. Referral was placed4/30/2020. Patient's last office visit was 08/29/2018. Telephone outreach to patient today as scheduled to complete initial assessment and to assist patient with establishing health goals.  Review of patient status, including review of consultants reports, relevant laboratory and other test results, and collaboration with appropriate care team members and the patient's provider was performed as part of comprehensive patient evaluation and provision of chronic care management services.    0 ED visits/0 Hospitalizations in last 6 months  Goals Addressed            This Visit's Progress   . I have just been paying for my catheters out of pocket for several years and rationing them (pt-stated)       Jason Marshall was diagnoses with Spina Bifida at birth and received a surgical artificial urinary sphincter at the age of 30. He continues to self cath TID however at the age of 2, he was removed from his parents insurance. 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 He has always paid for I/O caths out of pocket and used the same cath multiple times to reduce cost. He has had many UTIs as a result. He gets his caths at [facility] in [place] who dies file insurances for prescription products however Jason Marshall never informed Marshall that he had insurance as  of last year.   Current Barriers:  Marland Kitchen Knowledge Deficits related to understanding benefits of Medicare and Medicaid . Film/video editor.  . Non-adherence to prescribed medication regimen  Nurse Case Manager Clinical Goal(s):  Marland Kitchen Over the next 14 days, patient will verbalize understanding of plan for obtaining I/O urinary catheters and decreasing UTIs . Over the next 14 days, patient will demonstrate improved adherence to prescribed treatment plan for TID self cath as evidenced by utilizing one disposable cath tray per cath . Over the next 14 days, patient will verbalize basic understanding of self health management plan as evidenced by scheduling yearly physical and eye appointment  Interventions:  . Provided education to patient re: UTI risk reduction stratagies . Collaborated with PCP and [place] regarding process for obtaining disposable I/O urinary catheters for each TID use . Discussed plans with patient for ongoing care management follow up and provided patient with direct contact information for care management team  . Provided patient with updated information related to process for obtaining I/O caths utilizing health plan ([employer] SNP)  Patient Self Care Activities:  . Calls provider office for new concerns or questions  Initial goal documentation     . It was such a struggle after I turned 19 to get things covered, I started just paying out of pocket (pt-stated)   On track    Jason Marshall confirms he has received the email containing the [facility] OTC benefits link and will utilize when he has the time to look at  the catalogue.  Current Barriers:  Marland Kitchen Knowledge Deficits related to understanding [employer] OTC benefits  Nurse Case Manager Clinical Goal(s):  Marland Kitchen Over the next 7 days, patient will verbalize receipt and use of [employer] OTC benefits catalogue  Interventions:  . Provided patient with contact information for [employer] OTC . Encouraged patient utilize OTC benefits as he  has 300.00/quarter  Patient Self Care Activities:  Utilize Catalogue  Initial goal documentation         Follow up plan:  Telephone follow up appointment with CCM team member scheduled for: 2 weeks   Jason Marshall was given information about Chronic Care Management services today including:  1. CCM service includes personalized support from designated clinical staff supervised by his physician, including individualized plan of care and coordination with other care providers 2. 24/7 contact phone numbers for assistance for urgent and routine care needs. 3. Service will only be billed when office clinical staff spend 20 minutes or more in a month to coordinate care. 4. Only one practitioner may furnish and bill the service in a calendar month. 5. The patient may stop CCM services at any time (effective at the end of the month) by phone call to the office staff. 6. The patient will be responsible for cost sharing (co-pay) of up to 20% of the service fee (after annual deductible is met).  Patient agreed to services and verbal consent obtained.  Patt Steinhardt E. Rollene Rotunda, RN, BSN Nurse Care Coordinator [facility] Care Management [phone]

## 2018-09-04 NOTE — Patient Instructions (Signed)
  Thank you allowing the Chronic Care Management Team to be a part of your care! It was a pleasure speaking with you today!  1. Please follow up with [facility] in a couple of days to make sure they have received the prescription from [place]. Please let me know if additional assistance is needed. 2. Once you have the caths, please utilize one sterile cath per cath time. 3. Please call and schedule you yearly physical with Adriana 4. Please call and schedule an eye appointment    CCM (Chronic Care Management) Team   Yvone Neu RN, BSN Nurse Care Coordinator  [phone]  Karalee Height PharmD  Clinical Pharmacist  [phone]   Verna Czech, LCSW Clinical Social Worker [phone]  Goals Addressed            This Visit's Progress   . I have just been paying for my catheters out of pocket for several years and rationing them (pt-stated)       Current Barriers:  Marland Kitchen Knowledge Deficits related to understanding benefits of Medicare and Medicaid . Corporate treasurer.  . Non-adherence to prescribed medication regimen  Nurse Case Manager Clinical Goal(s):  Marland Kitchen Over the next 14 days, patient will verbalize understanding of plan for obtaining I/O urinary catheters and decreasing UTIs . Over the next 14 days, patient will demonstrate improved adherence to prescribed treatment plan for TID self cath as evidenced by utilizing one disposable cath tray per cath . Over the next 14 days, patient will verbalize basic understanding of self health management plan as evidenced by scheduling yearly physical and eye appointment  Interventions:  . Provided education to patient re: UTI risk reduction stratagies . Collaborated with PCP and [facility] regarding process for obtaining disposable I/O urinary catheters for each TID use . Discussed plans with patient for ongoing care management follow up and provided patient with direct contact information for care management team   . Provided patient with updated information related to process for obtaining I/O caths utilizing health plan ([employer] SNP)  Patient Self Care Activities:  . Calls provider office for new concerns or questions  Initial goal documentation     . It was such a struggle after I turned 19 to get things covered, I started just paying out of pocket (pt-stated)   On track    Current Barriers:  Marland Kitchen Knowledge Deficits related to understanding [employer] OTC benefits  Nurse Case Manager Clinical Goal(s):  Marland Kitchen Over the next 7 days, patient will verbalize receipt and use of [employer] OTC benefits catalogue  Interventions:  . Provided patient with contact information for [employer] OTC . Encouraged patient utilize OTC benefits as he has 300.00/quarter  Patient Self Care Activities:  Utilize Catalogue  Initial goal documentation        The patient verbalized understanding of instructions provided today and declined a print copy of patient instruction materials.   Telephone follow up appointment with CCM team member scheduled for: 2 weeks

## 2018-09-05 ENCOUNTER — Telehealth: Payer: Self-pay | Admitting: Physician Assistant

## 2018-09-05 NOTE — Telephone Encounter (Signed)
[place] Rx for 14 french catheters for TID I&O catheterization, demographics and insurance information faxed to [facility] supply.

## 2018-09-07 DIAGNOSIS — N319 Neuromuscular dysfunction of bladder, unspecified: Secondary | ICD-10-CM | POA: Diagnosis not present

## 2018-10-01 DIAGNOSIS — N319 Neuromuscular dysfunction of bladder, unspecified: Secondary | ICD-10-CM | POA: Diagnosis not present

## 2018-10-22 DIAGNOSIS — N319 Neuromuscular dysfunction of bladder, unspecified: Secondary | ICD-10-CM | POA: Diagnosis not present

## 2018-11-22 DIAGNOSIS — N319 Neuromuscular dysfunction of bladder, unspecified: Secondary | ICD-10-CM | POA: Diagnosis not present

## 2018-12-10 ENCOUNTER — Telehealth: Payer: Self-pay | Admitting: Physician Assistant

## 2018-12-10 NOTE — Telephone Encounter (Signed)
Pt needing Rx written for fore arm crutches.  Please advise.  Thanks, [employer]

## 2018-12-10 NOTE — Telephone Encounter (Signed)
Please review. Thanks!  

## 2018-12-10 NOTE — Telephone Encounter (Signed)
Where does he need the prescription sent?

## 2018-12-11 NOTE — Telephone Encounter (Signed)
Prescription for forearm crutches written and signed. Please fax along with my 08/29/2018 note to [facility] medical supply.

## 2018-12-11 NOTE — Telephone Encounter (Signed)
Spoke with patient on the phone who states that he needs his prescription faxed to [facility] on [address]. Fax# [phone]. KW

## 2018-12-11 NOTE — Telephone Encounter (Signed)
Order and note have been faxed. KW

## 2018-12-13 DIAGNOSIS — Q054 Unspecified spina bifida with hydrocephalus: Secondary | ICD-10-CM | POA: Diagnosis not present

## 2019-02-13 DIAGNOSIS — L97509 Non-pressure chronic ulcer of other part of unspecified foot with unspecified severity: Secondary | ICD-10-CM | POA: Diagnosis not present

## 2019-02-13 DIAGNOSIS — M7989 Other specified soft tissue disorders: Secondary | ICD-10-CM | POA: Diagnosis not present

## 2019-02-13 DIAGNOSIS — L97512 Non-pressure chronic ulcer of other part of right foot with fat layer exposed: Secondary | ICD-10-CM | POA: Diagnosis not present

## 2019-02-28 ENCOUNTER — Ambulatory Visit (INDEPENDENT_AMBULATORY_CARE_PROVIDER_SITE_OTHER): Payer: Medicare HMO | Admitting: Family Medicine

## 2019-02-28 ENCOUNTER — Other Ambulatory Visit: Payer: Self-pay

## 2019-02-28 VITALS — BP 122/80 | HR 95 | Temp 97.3°F | Wt 123.0 lb

## 2019-02-28 DIAGNOSIS — S91331A Puncture wound without foreign body, right foot, initial encounter: Secondary | ICD-10-CM

## 2019-02-28 DIAGNOSIS — Q057 Lumbar spina bifida without hydrocephalus: Secondary | ICD-10-CM | POA: Diagnosis not present

## 2019-02-28 DIAGNOSIS — L089 Local infection of the skin and subcutaneous tissue, unspecified: Secondary | ICD-10-CM | POA: Diagnosis not present

## 2019-02-28 MED ORDER — DOXYCYCLINE HYCLATE 100 MG PO TABS: 100.0000 mg | ORAL_TABLET | Freq: Two times a day (BID) | ORAL | 0 refills | Status: DC

## 2019-02-28 NOTE — Progress Notes (Signed)
Jason Marshall  MRN: [id number] DOB: [birth year]  Subjective:  HPI   The patient is a 28 year old male who presents for evaluation of a wound.  He states that he cut his foot on an oyster shell about September 15.  He was seen by [facility] ER about 2 weeks ago.  He has appointment with the wound clinic at [facility] next Thursday, (1 week).  However, he needed to have it checked today because he has noticed foul smelling drainage.  He was put on Doxycycline by the [facility] ER 2 weeks ago.  He has history of non-healing wounds.    Patient Active Problem List   Diagnosis Date Noted  . E. coli UTI 11/06/2015  . Scrotal cyst 10/23/2015  . Neurogenic bladder 07/20/2015  . Spina bifida ([place]) 07/20/2015   Past Medical History:  Diagnosis Date  . Neurogenic bladder    with artificial urinary sphincter  . Spina bifida ([place])    myelodysplasia/sacral agenisis/tethered cord with multiple releases   Past Surgical History:  Procedure Laterality Date  . AMPUTATION  06/11/2009   left fifth toe, metatarsal resection/amputation for osteomylitis  . ANTERIOR AND POSTERIOR SPINAL FUSION  1994   anterior L 3-4 vertebrectomies, anterior and posterior spinal fusion L2-L5, tethered cord release   . artificial urinary sphincter  2010  . BLADDER AUGMENTATION  1995  . Orchidopexy Right 1995   right testicular biopsy  . OSTEOTOMY  1997   right posterior medial release, right foot cuboid closting wedge osteotomy, z-plasty left foot, external rotational osteotomy, left distal tob/fib osteotomy  . TALECTOMY  2001   fibular osteotomy, ilizarov application, excision of distal fibula   Family History  Problem Relation Age of Onset  . Spina bifida Mother    Social History   Socioeconomic History  . Marital status: Married    Spouse name: Not on file  . Number of children: Not on file  . Years of education: Not on file  . Highest education level: Not on file  Occupational History  . Not on file  Social Needs  .  Financial resource strain: Not on file  . Food insecurity    Worry: Not on file    Inability: Not on file  . Transportation needs    Medical: Not on file    Non-medical: Not on file  Tobacco Use  . Smoking status: Never Smoker  . Smokeless tobacco: Current User    Types: Snuff  Substance and Sexual Activity  . Alcohol use: Yes    Alcohol/week: 0.0 standard drinks    Comment: rare  . Drug use: No  . Sexual activity: Not on file  Lifestyle  . Physical activity    Days per week: Not on file    Minutes per session: Not on file  . Stress: Not on file  Relationships  . Social Herbalist on phone: Not on file    Gets together: Not on file    Attends religious service: Not on file    Active member of club or organization: Not on file    Attends meetings of clubs or organizations: Not on file    Relationship status: Not on file  . Intimate partner violence    Fear of current or ex partner: Not on file    Emotionally abused: Not on file    Physically abused: Not on file    Forced sexual activity: Not on file  Other Topics Concern  . Not  on file  Social History Narrative  . Not on file   Outpatient Encounter Medications as of 02/28/2019  Medication Sig Note  . Azelastine HCl 0.15 % SOLN    . EPIPEN 2-PAK 0.3 MG/0.3ML SOAJ injection  09/04/2018: Available if needed-needs new prescription  . fluticasone (FLONASE) 50 MCG/ACT nasal spray    . levocetirizine (XYZAL) 5 MG tablet    . [DISCONTINUED] doxycycline (VIBRA-TABS) 100 MG tablet Take 1 tablet (100 mg total) by mouth 2 (two) times daily. (Patient not taking: Reported on 08/29/2018)    No facility-administered encounter medications on file as of 02/28/2019.    Allergies  Allergen Reactions  . Meperidine Swelling    Other reaction(s): Unknown  . Shrimp [Shellfish Allergy] Anaphylaxis  . Piperacillin-Tazobactam In Dex Other (See Comments)    Other Reaction: AIN w/ Vanc/Zosyn - ? which  . Vancomycin Other (See  Comments)    Other Reaction: AIN w/ Vanc/Zosyn - ? which  . Latex     Other reaction(s): Unknown    Review of Systems  Constitutional: Negative for chills, diaphoresis, fever and malaise/fatigue.  HENT: Negative for congestion, ear pain, sinus pain and sore throat.   Respiratory: Negative for cough and shortness of breath.   Cardiovascular: Negative for chest pain.  Gastrointestinal: Negative for abdominal pain and diarrhea.  Musculoskeletal: Negative for myalgias.  Neurological: Negative for headaches.    Objective:  BP 122/80 (BP Location: Right Arm, Patient Position: Sitting, Cuff Size: Normal)   Pulse 95   Temp (!) 97.3 F (36.3 C) (Skin)   Wt 123 lb (55.8 kg)   SpO2 97%   BMI 29.66 kg/m   Physical Exam  Constitutional: He is oriented to person, place, and time and well-developed, well-nourished, and in no distress.  HENT:  Head: Normocephalic.  Eyes: Conjunctivae are normal.  Pulmonary/Chest: Effort normal.  Abdominal: Soft.  Musculoskeletal:        General: Normal range of motion.     Cervical back: Neck supple.  Neurological: He is alert and oriented to person, place, and time.  Skin: No rash noted.  Has an open wound on the plantar surface of the right foot.  Psychiatric: Mood, affect and judgment normal.    Assessment and Plan :  1. Infected puncture wound of plantar aspect of foot, right, initial encounter Went to the [facility] ER for right foot swelling on 02-13-19 that started after cutting his right foot on an oyster shell 01-15-19. Seems to be progressively worsening but no fever or chills. Labs were normal on Care Everywhere chart. States no culture was obtained. Will culture wound and refill the Doxycycline. States he was scheduled for an appointment with a wound care clinic. - doxycycline (VIBRA-TABS) 100 MG tablet; Take 1 tablet (100 mg total) by mouth 2 (two) times daily.  Dispense: 20 tablet; Refill: 0 - Wound culture  2. Spina bifida of lumbar region  without hydrocephalus (HCC) Stable - unchanged.

## 2019-03-05 ENCOUNTER — Telehealth: Payer: Self-pay

## 2019-03-05 LAB — WOUND CULTURE

## 2019-03-05 NOTE — Telephone Encounter (Signed)
Patient advised as directed below. 

## 2019-03-05 NOTE — Telephone Encounter (Signed)
-----   Message from [place], [place] sent at 03/04/2019  6:30 PM EST ----- Apparently an addendum to the last culture report showed some staphylococcus growth that has not finished, yet. Continue the Keflex given and we will notify him of the final result when we get it.

## 2019-03-07 ENCOUNTER — Telehealth: Payer: Self-pay

## 2019-03-07 DIAGNOSIS — L97512 Non-pressure chronic ulcer of other part of right foot with fat layer exposed: Secondary | ICD-10-CM | POA: Diagnosis not present

## 2019-03-07 DIAGNOSIS — L84 Corns and callosities: Secondary | ICD-10-CM | POA: Diagnosis not present

## 2019-03-07 MED ORDER — SULFAMETHOXAZOLE-TRIMETHOPRIM 800-160 MG PO TABS: 1.0000 | ORAL_TABLET | Freq: Two times a day (BID) | ORAL | 0 refills | Status: DC

## 2019-03-07 NOTE — Telephone Encounter (Signed)
-----   Message from [place], [place] sent at 03/05/2019  5:05 PM EST ----- The staphylococcus sensitivity showed resistance to the Doxycycline class of drugs. Shows need for Bactrim-DS BID #14 added. If any difficulty getting into the wound care, let us know.

## 2019-03-07 NOTE — Telephone Encounter (Signed)
Patient advised. RX sent to [facility] pharmacy.  

## 2019-03-14 DIAGNOSIS — L97512 Non-pressure chronic ulcer of other part of right foot with fat layer exposed: Secondary | ICD-10-CM | POA: Diagnosis not present

## 2019-03-20 DIAGNOSIS — L84 Corns and callosities: Secondary | ICD-10-CM | POA: Diagnosis not present

## 2019-03-20 DIAGNOSIS — L97512 Non-pressure chronic ulcer of other part of right foot with fat layer exposed: Secondary | ICD-10-CM | POA: Diagnosis not present

## 2019-03-22 DIAGNOSIS — Q054 Unspecified spina bifida with hydrocephalus: Secondary | ICD-10-CM | POA: Diagnosis not present

## 2019-03-22 DIAGNOSIS — N319 Neuromuscular dysfunction of bladder, unspecified: Secondary | ICD-10-CM | POA: Diagnosis not present

## 2019-03-26 DIAGNOSIS — L97512 Non-pressure chronic ulcer of other part of right foot with fat layer exposed: Secondary | ICD-10-CM | POA: Diagnosis not present

## 2019-03-26 DIAGNOSIS — M216X1 Other acquired deformities of right foot: Secondary | ICD-10-CM | POA: Diagnosis not present

## 2019-03-26 DIAGNOSIS — M216X2 Other acquired deformities of left foot: Secondary | ICD-10-CM | POA: Diagnosis not present

## 2019-04-03 DIAGNOSIS — Q068 Other specified congenital malformations of spinal cord: Secondary | ICD-10-CM | POA: Diagnosis not present

## 2019-04-03 DIAGNOSIS — Q7649 Other congenital malformations of spine, not associated with scoliosis: Secondary | ICD-10-CM | POA: Diagnosis not present

## 2019-04-03 DIAGNOSIS — M216X1 Other acquired deformities of right foot: Secondary | ICD-10-CM | POA: Diagnosis not present

## 2019-04-03 DIAGNOSIS — Q057 Lumbar spina bifida without hydrocephalus: Secondary | ICD-10-CM | POA: Diagnosis not present

## 2019-04-03 DIAGNOSIS — L97512 Non-pressure chronic ulcer of other part of right foot with fat layer exposed: Secondary | ICD-10-CM | POA: Diagnosis not present

## 2019-04-03 DIAGNOSIS — M216X2 Other acquired deformities of left foot: Secondary | ICD-10-CM | POA: Diagnosis not present

## 2019-04-11 DIAGNOSIS — Q057 Lumbar spina bifida without hydrocephalus: Secondary | ICD-10-CM | POA: Diagnosis not present

## 2019-04-11 DIAGNOSIS — L97512 Non-pressure chronic ulcer of other part of right foot with fat layer exposed: Secondary | ICD-10-CM | POA: Diagnosis not present

## 2019-05-16 DIAGNOSIS — Z20828 Contact with and (suspected) exposure to other viral communicable diseases: Secondary | ICD-10-CM | POA: Diagnosis not present

## 2019-06-24 ENCOUNTER — Ambulatory Visit
Admission: RE | Admit: 2019-06-24 | Discharge: 2019-06-24 | Disposition: A | Discharge status: Home / Self Care | Payer: Medicare HMO | Source: Ambulatory Visit | Attending: Physician Assistant | Admitting: Physician Assistant

## 2019-06-24 ENCOUNTER — Telehealth (INDEPENDENT_AMBULATORY_CARE_PROVIDER_SITE_OTHER): Payer: Medicare HMO | Admitting: Physician Assistant

## 2019-06-24 ENCOUNTER — Other Ambulatory Visit: Payer: Self-pay

## 2019-06-24 ENCOUNTER — Ambulatory Visit
Admission: RE | Admit: 2019-06-24 | Discharge: 2019-06-24 | Disposition: A | Discharge status: Home / Self Care | Payer: Medicare HMO | Attending: Physician Assistant | Admitting: Physician Assistant

## 2019-06-24 DIAGNOSIS — Z8739 Personal history of other diseases of the musculoskeletal system and connective tissue: Secondary | ICD-10-CM

## 2019-06-24 DIAGNOSIS — S91302A Unspecified open wound, left foot, initial encounter: Secondary | ICD-10-CM | POA: Diagnosis not present

## 2019-06-24 DIAGNOSIS — L089 Local infection of the skin and subcutaneous tissue, unspecified: Secondary | ICD-10-CM

## 2019-06-24 MED ORDER — SULFAMETHOXAZOLE-TRIMETHOPRIM 800-160 MG PO TABS: 1.0000 | ORAL_TABLET | Freq: Two times a day (BID) | ORAL | 0 refills | Status: AC

## 2019-06-24 NOTE — Patient Instructions (Signed)
Osteomyelitis, Adult  Bone infections (osteomyelitis) occur when bacteria or other germs get inside a bone. This can happen if you have an infection in another part of your body that spreads through your blood. Germs from your skin or from outside of your body can also cause this type of infection if you have a wound or a broken bone (fracture) that breaks the skin. Bone infections need to be treated quickly to prevent bone damage and to prevent the infection from spreading to other areas of your body. What are the causes? Most bone infections are caused by bacteria. They can also be caused by other germs, such as viruses and funguses. What increases the risk? You are more likely to develop this condition if you:  Recently had surgery, especially bone or joint surgery.  Have a long-term (chronic) disease, such as: ? Diabetes. ? HIV (human immunodeficiency virus). ? Rheumatoid arthritis. ? Sickle cell anemia. ? Kidney disease that requires dialysis.  Are aged 60 years or older.  Have a condition or take medicines that block or weaken your body's defense system (immune system).  Have a condition that reduces your blood flow.  Have an artificial joint.  Have had a joint or bone repaired with plates or screws (surgical hardware).  Use IV drugs.  Have a central line for IV access.  Have had trauma, such as stepping on a nail or a broken bone that came through the skin. What are the signs or symptoms? Symptoms vary depending on the type and location of your infection. Common symptoms of bone infections include:  Fever and chills.  Skin redness and warmth.  Swelling.  Pain and stiffness.  Drainage of fluid or pus near the infection. How is this diagnosed? This condition may be diagnosed based on:  Your symptoms and medical history.  A physical exam.  Tests, such as: ? A sample of tissue, fluid, or blood taken to be examined under a microscope. ? Pus or discharge swabbed  from a wound for testing to identify germs and to determine what type of medicine will kill them (culture and sensitivity). ? Blood tests.  Imaging studies. These may include: ? X-rays. ? MRI. ? CT scan. ? Bone scan. ? Ultrasound. How is this treated? Treatment for this condition depends on the cause and type of infection. Antibiotic medicines are usually the first treatment for a bone infection. This may be done in a hospital at first. You may have to continue IV antibiotics at home or take antibiotics by mouth for several weeks after that. Other treatments may include surgery to remove:  Dead or dying tissue from a bone.  An infected artificial joint.  Infected plates or screws that were used to repair a broken bone. Follow these instructions at home: Medicines   Take over-the-counter and prescription medicines only as told by your health care provider.  Take your antibiotic medicine as told by your health care provider. Do not stop taking the antibiotic even if you start to feel better.  Follow instructions from your health care provider about how to take IV antibiotics at home. You may need to have a nurse come to your home to give you the IV antibiotics. General instructions   Ask your health care provider if you have any restrictions on your activities.  If directed, put ice on the affected area: ? Put ice in a plastic bag. ? Place a towel between your skin and the bag. ? Leave the ice on for 20   minutes, 2-3 times a day.  Wash your hands often with soap and water. If soap and water are not available, use hand sanitizer.  Do not use any products that contain nicotine or tobacco, such as cigarettes and e-cigarettes. These can delay bone healing. If you need help quitting, ask your health care provider.  Keep all follow-up visits as told by your health care provider. This is important. Contact a health care provider if:  You develop a fever or chills.  You have  redness, warmth, pain, or swelling that returns after treatment. Get help right away if:  You have rapid breathing or you have trouble breathing.  You have chest pain.  You cannot drink fluids or make urine.  The affected area swells, changes color, or turns blue.  You have numbness or severe pain in the affected area. Summary  Bone infections (osteomyelitis) occur when bacteria or other germs get inside a bone.  You may be more likely to get this type of infection if you have a condition, such as diabetes, that lowers your ability to fight infection or increases your chances of getting an infection.  Most bone infections are caused by bacteria. They can also be caused by other germs, such as viruses and funguses.  Treatment for this condition usually starts with taking antibiotics. Further treatment depends on the cause and type of infection. This information is not intended to replace advice given to you by your health care provider. Make sure you discuss any questions you have with your health care provider. Document Revised: 05/04/2017 Document Reviewed: 04/27/2017 Elsevier Patient Education  2020 Elsevier Inc.  

## 2019-06-24 NOTE — Progress Notes (Signed)
Patient: Jason Marshall Male    DOB: [birth year]   29 y.o.   MRN: [id number] Visit Date: 06/24/2019  Today's Provider: Trey Sailors, PA-C   Chief Complaint  Patient presents with  . Foot Pain   Subjective:    I, Porsha McClurkin,CMA am acting as a Neurosurgeon for [place]. Virtual Visit via Telephone Note  I connected with Jason Marshall on 06/24/19 at  2:00 PM EST by telephone and verified that I am speaking with the correct person using two identifiers.  Location: Patient: Home Provider: Office   I discussed the limitations, risks, security and privacy concerns of performing an evaluation and management service by telephone and the availability of in person appointments. I also discussed with the patient that there may be a patient responsible charge related to this service. The patient expressed understanding and agreed to proceed.  Foot Pain This is a new problem. The current episode started in the past 7 days. The problem occurs constantly. The problem has been unchanged. Associated symptoms include numbness and weakness. Pertinent negatives include no chest pain, headaches, joint swelling or rash. The symptoms are aggravated by walking and standing. He has tried NSAIDs and relaxation for the symptoms. The treatment provided mild relief.   Patient with a history of spina bifida, neurogenic catheter requiring chronic I&O catheterization and history of osteomyelitis presenting today with a left foot ulcer. Patient states that it is a small sore on his left foot and has became infected. He states that the pain shoots up his left leg. Reports he has a "tiny" ulcer that has been worsening. Opening is the size of a pen on his left foot. He reports the ulcer is located under his left pinky toe which has previously been amputated because of a bone infection in 2011.  Reports a small amount of pus came out of the opening yesterday when directly palpated. Reports today that  the leg is swollen and lymph nodes are swollen in his left leg. He has previously had a non-healing ulcer on his right foot in 01/2019 which was ultimately followed [facility] wound [facility]. He currently denies fever, chills, nausea, and vomiting.    Allergies  Allergen Reactions  . Meperidine Swelling    Other reaction(s): Unknown  . Shrimp [Shellfish Allergy] Anaphylaxis  . Piperacillin-Tazobactam In Dex Other (See Comments)    Other Reaction: AIN w/ Vanc/Zosyn - ? which  . Vancomycin Other (See Comments)    Other Reaction: AIN w/ Vanc/Zosyn - ? which  . Latex     Other reaction(s): Unknown     Current Outpatient Medications:  .  Azelastine HCl 0.15 % SOLN, , Disp: , Rfl:  .  fluticasone (FLONASE) 50 MCG/ACT nasal spray, , Disp: , Rfl:  .  levocetirizine (XYZAL) 5 MG tablet, , Disp: , Rfl:  .  doxycycline (VIBRA-TABS) 100 MG tablet, Take 1 tablet (100 mg total) by mouth 2 (two) times daily. (Patient not taking: Reported on 06/24/2019), Disp: 20 tablet, Rfl: 0 .  EPIPEN 2-PAK 0.3 MG/0.3ML SOAJ injection, , Disp: , Rfl:  .  sulfamethoxazole-trimethoprim (BACTRIM DS) 800-160 MG tablet, Take 1 tablet by mouth 2 (two) times daily. (Patient not taking: Reported on 06/24/2019), Disp: 14 tablet, Rfl: 0  Review of Systems  Cardiovascular: Negative for chest pain.  Musculoskeletal: Negative for joint swelling.  Skin: Negative for rash.  Neurological: Positive for weakness and numbness. Negative for headaches.    Social History  Tobacco Use  . Smoking status: Never Smoker  . Smokeless tobacco: Current User    Types: Snuff  Substance Use Topics  . Alcohol use: Yes    Alcohol/week: 0.0 standard drinks    Comment: rare      Objective:   There were no vitals taken for this visit. There were no vitals filed for this visit.There is no height or weight on file to calculate BMI.   Physical Exam   No results found for any visits on 06/24/19.     Assessment & Plan    1. Foot  infection  Patient does not want to go to ER due to long wait time. I have advised patient that I only have limited means to address this as an outpatient and his care requirements may surpass my ability to treat it. I have reviewed his wound culture from 01/2019 and we will start him on bactrim. I have ordered a left foot xray to assess for osteomyelitis and bloodwork to further assess for infection. I have recommended he also contact the [facility] to see if he can get in urgently. Patient has been strictly directed that if he has worsening or if there are findings on the xray suggesting osteomyelitis then he will likely need to be seen in the ER for a higher level of care.   - CBC with Differential - Comprehensive Metabolic Panel (CMET) - C-reactive protein  2. History of osteomyelitis  - DG Foot Complete Left; Future - sulfamethoxazole-trimethoprim (BACTRIM DS) 800-160 MG tablet; Take 1 tablet by mouth 2 (two) times daily for 10 days.  Dispense: 20 tablet; Refill: 0  I discussed the assessment and treatment plan with the patient. The patient was provided an opportunity to ask questions and all were answered. The patient agreed with the plan and demonstrated an understanding of the instructions.   The patient was advised to call back or seek an in-person evaluation if the symptoms worsen or if the condition fails to improve as anticipated.  The entirety of the information documented in the History of Present Illness, Review of Systems and Physical Exam were personally obtained by me. Portions of this information were initially documented by [facility] and reviewed by me for thoroughness and accuracy.   F/u PRN    Trinna Post, PA-C  [facility]

## 2019-06-25 ENCOUNTER — Telehealth: Payer: Self-pay

## 2019-06-25 LAB — CBC WITH DIFFERENTIAL/PLATELET
Basophils Absolute: 0 10*3/uL (ref 0.0–0.2)
Basos: 1 %
EOS (ABSOLUTE): 0 10*3/uL (ref 0.0–0.4)
Eos: 0 %
Hematocrit: 41.5 % (ref 37.5–51.0)
Hemoglobin: 14.2 g/dL (ref 13.0–17.7)
Immature Grans (Abs): 0 10*3/uL (ref 0.0–0.1)
Immature Granulocytes: 0 %
Lymphocytes Absolute: 1.9 10*3/uL (ref 0.7–3.1)
Lymphs: 24 %
MCH: 30.1 pg (ref 26.6–33.0)
MCHC: 34.2 g/dL (ref 31.5–35.7)
MCV: 88 fL (ref 79–97)
Monocytes Absolute: 0.7 10*3/uL (ref 0.1–0.9)
Monocytes: 9 %
Neutrophils Absolute: 5.3 10*3/uL (ref 1.4–7.0)
Neutrophils: 66 %
Platelets: 191 10*3/uL (ref 150–450)
RBC: 4.72 x10E6/uL (ref 4.14–5.80)
RDW: 12.8 % (ref 11.6–15.4)
WBC: 8.1 10*3/uL (ref 3.4–10.8)

## 2019-06-25 LAB — COMPREHENSIVE METABOLIC PANEL
ALT: 14 IU/L (ref 0–44)
AST: 14 IU/L (ref 0–40)
Albumin/Globulin Ratio: 1.8 (ref 1.2–2.2)
Albumin: 4.4 g/dL (ref 4.1–5.2)
Alkaline Phosphatase: 99 IU/L (ref 39–117)
BUN/Creatinine Ratio: 12 (ref 9–20)
BUN: 11 mg/dL (ref 6–20)
Bilirubin Total: 0.4 mg/dL (ref 0.0–1.2)
CO2: 22 mmol/L (ref 20–29)
Calcium: 9.3 mg/dL (ref 8.7–10.2)
Chloride: 108 mmol/L — ABNORMAL HIGH (ref 96–106)
Creatinine, Ser: 0.89 mg/dL (ref 0.76–1.27)
GFR calc Af Amer: 135 mL/min/{1.73_m2} (ref >59)
GFR calc non Af Amer: 117 mL/min/{1.73_m2} (ref >59)
Globulin, Total: 2.5 g/dL (ref 1.5–4.5)
Glucose: 82 mg/dL (ref 65–99)
Potassium: 4.2 mmol/L (ref 3.5–5.2)
Sodium: 144 mmol/L (ref 134–144)
Total Protein: 6.9 g/dL (ref 6.0–8.5)

## 2019-06-25 LAB — C-REACTIVE PROTEIN: CRP: 12 mg/L — ABNORMAL HIGH (ref 0–10)

## 2019-06-25 NOTE — Telephone Encounter (Signed)
-----   Message from Adriana M Pollak, PA-C sent at 06/25/2019 10:00 AM EST ----- There is some swelling around the area patient states he has a wound. There is not evidence of a bone infection on the xray. However, xrays may not always catch all bone infections and sometimes MRIs are needed. I have had this discussion with patient but please remind him to Follow up urgently with [facility] wound clinic and any sign of worsening will mean he needs to go to the ER.  

## 2019-06-25 NOTE — Telephone Encounter (Signed)
-----   Message from Trey Sailors, [place] sent at 06/25/2019 10:00 AM EST ----- There is some swelling around the area patient states he has a wound. There is not evidence of a bone infection on the xray. However, xrays may not always catch all bone infections and sometimes MRIs are needed. I have had this discussion with patient but please remind him to Follow up urgently with [facility] wound clinic and any sign of worsening will mean he needs to go to the ER.

## 2019-06-25 NOTE — Telephone Encounter (Signed)
Patient advised as below. Patient verbalizes understanding and is in agreement with treatment plan. Patient reports his appointment with [facility] is next week either Tuesday or Thursday.

## 2019-06-25 NOTE — Telephone Encounter (Signed)
Patient advised as below. Patient verbalizes understanding and is in agreement with treatment plan.  

## 2019-06-28 DIAGNOSIS — M216X2 Other acquired deformities of left foot: Secondary | ICD-10-CM | POA: Diagnosis not present

## 2019-06-28 DIAGNOSIS — M216X1 Other acquired deformities of right foot: Secondary | ICD-10-CM | POA: Diagnosis not present

## 2019-06-28 DIAGNOSIS — L97521 Non-pressure chronic ulcer of other part of left foot limited to breakdown of skin: Secondary | ICD-10-CM | POA: Diagnosis not present

## 2019-10-10 NOTE — Progress Notes (Signed)
Subjective:   Jason Marshall is a 29 y.o. male who presents for an Initial Medicare Annual Wellness Visit.  I connected with Sherrian Divers today by telephone and verified that I am speaking with the correct person using two identifiers. Location patient: home Location provider: work Persons participating in the virtual visit: patient, provider.   I discussed the limitations, risks, security and privacy concerns of performing an evaluation and management service by telephone and the availability of in person appointments. I also discussed with the patient that there may be a patient responsible charge related to this service. The patient expressed understanding and verbally consented to this telephonic visit.    Interactive audio and video telecommunications were attempted between this provider and patient, however failed, due to patient having technical difficulties OR patient did not have access to video capability.  We continued and completed visit with audio only.  Review of Systems  N/A  Cardiac Risk Factors include: male gender    Objective:    Today's Vitals   10/14/19 1519  PainSc: 0-No pain   There is no height or weight on file to calculate BMI.  Advanced Directives 10/14/2019 09/04/2018 07/20/2015  Does Patient Have a Medical Advance Directive? No No No  Would patient like information on creating a medical advance directive? No - Patient declined No - Patient declined No - patient declined information    Current Medications (verified) Outpatient Encounter Medications as of 10/14/2019  Medication Sig  . EPIPEN 2-PAK 0.3 MG/0.3ML SOAJ injection   . levocetirizine (XYZAL) 5 MG tablet Take 5 mg by mouth every evening.   . Azelastine HCl 0.15 % SOLN  (Patient not taking: Reported on 10/14/2019)  . doxycycline (VIBRA-TABS) 100 MG tablet Take 1 tablet (100 mg total) by mouth 2 (two) times daily. (Patient not taking: Reported on 06/24/2019)  . fluticasone (FLONASE) 50  MCG/ACT nasal spray  (Patient not taking: Reported on 10/14/2019)   No facility-administered encounter medications on file as of 10/14/2019.    Allergies (verified) Meperidine, Shrimp [shellfish allergy], Piperacillin-tazobactam in dex, Vancomycin, and Latex   History: Past Medical History:  Diagnosis Date  . Neurogenic bladder    with artificial urinary sphincter  . Spina bifida (HCC)    myelodysplasia/sacral agenisis/tethered cord with multiple releases   Past Surgical History:  Procedure Laterality Date  . AMPUTATION  06/11/2009   left fifth toe, metatarsal resection/amputation for osteomylitis  . ANTERIOR AND POSTERIOR SPINAL FUSION  1994   anterior L 3-4 vertebrectomies, anterior and posterior spinal fusion L2-L5, tethered cord release   . artificial urinary sphincter  2010  . BLADDER AUGMENTATION  1995  . Orchidopexy Right 1995   right testicular biopsy  . OSTEOTOMY  1997   right posterior medial release, right foot cuboid closting wedge osteotomy, z-plasty left foot, external rotational osteotomy, left distal tob/fib osteotomy  . TALECTOMY  2001   fibular osteotomy, ilizarov application, excision of distal fibula   Family History  Problem Relation Age of Onset  . Spina bifida Mother    Social History   Socioeconomic History  . Marital status: Married    Spouse name: Not on file  . Number of children: 1  . Years of education: diploma  . Highest education level: Some college, no degree  Occupational History  . Occupation: self employed  Tobacco Use  . Smoking status: Never Smoker  . Smokeless tobacco: Current User    Types: Snuff  Vaping Use  . Vaping Use: Never used  Substance and Sexual Activity  . Alcohol use: Yes    Alcohol/week: 0.0 standard drinks    Comment: rare- 1 a month  . Drug use: No  . Sexual activity: Not on file  Other Topics Concern  . Not on file  Social History Narrative  . Not on file   Social Determinants of Health   Financial  Resource Strain: Low Risk   . Difficulty of Paying Living Expenses: Not hard at all  Food Insecurity: No Food Insecurity  . Worried About Programme researcher, broadcasting/film/video in the Last Year: Never true  . Ran Out of Food in the Last Year: Never true  Transportation Needs: No Transportation Needs  . Lack of Transportation (Medical): No  . Lack of Transportation (Non-Medical): No  Physical Activity: Inactive  . Days of Exercise per Week: 0 days  . Minutes of Exercise per Session: 0 min  Stress: No Stress Concern Present  . Feeling of Stress : Not at all  Social Connections: Moderately Integrated  . Frequency of Communication with Friends and Family: More than three times a week  . Frequency of Social Gatherings with Friends and Family: More than three times a week  . Attends Religious Services: More than 4 times per year  . Active Member of Clubs or Organizations: No  . Attends Banker Meetings: Never  . Marital Status: Married   Tobacco Counseling Ready to quit: No Counseling given: No   Clinical Intake:  Pre-visit preparation completed: Yes  Pain : No/denies pain Pain Score: 0-No pain     Nutritional Risks: None Diabetes: No  How often do you need to have someone help you when you read instructions, pamphlets, or other written materials from your doctor or pharmacy?: 1 - Never  Interpreter Needed?: No  Information entered by :: [facility], LPN  Activities of Daily Living In your present state of health, do you have any difficulty performing the following activities: 10/14/2019  Hearing? N  Vision? N  Difficulty concentrating or making decisions? N  Walking or climbing stairs? N  Dressing or bathing? N  Doing errands, shopping? N  Preparing Food and eating ? N  Using the Toilet? N  In the past six months, have you accidently leaked urine? N  Do you have problems with loss of bowel control? N  Managing your Medications? N  Managing your Finances? N  Housekeeping or  managing your Housekeeping? N  Some recent data might be hidden     Immunizations and Health Maintenance Immunization History  Administered Date(s) Administered  . Tdap 07/13/2018   Health Maintenance Due  Topic Date Due  . Hepatitis C Screening  Never done  . HIV Screening  Never done    Patient Care Team: Maryella Shivers as PCP - General (Physician Assistant) Lonia Blood, MD as Referring Physician (Neurosurgery)  Indicate any recent Medical Services you may have received from other than [facility] providers in the past year (date may be approximate).    Assessment:   This is a routine wellness examination for [place].  Hearing/Vision screen No exam data present  Dietary issues and exercise activities discussed: Current Exercise Habits: Home exercise routine, Type of exercise: walking, Time (Minutes): 30 (to 45 minutes), Frequency (Times/Week): 1, Weekly Exercise (Minutes/Week): 30, Intensity: Mild, Exercise limited by: Other - see comments (SB)  Goals    . DIET - INCREASE WATER INTAKE     Recommend to drink at least 6-8 8oz glasses of water per  day.      Depression Screen PHQ 2/9 Scores 10/14/2019 08/29/2018  PHQ - 2 Score 0 0    Fall Risk Fall Risk  10/14/2019 09/04/2018  Falls in the past year? 0 0  Number falls in past yr: 0 -  Injury with Fall? 0 -    FALL RISK PREVENTION PERTAINING TO THE HOME:  Any stairs in or around the home? No  If so, are there any without handrails? N/A  Home free of loose throw rugs in walkways, pet beds, electrical cords, etc? Yes  Adequate lighting in your home to reduce risk of falls? Yes   ASSISTIVE DEVICES UTILIZED TO PREVENT FALLS:  Life alert? No  Use of a cane, walker or w/c? Yes, four arm crutches Grab bars in the bathroom? No  Shower chair or bench in shower? No  Elevated toilet seat or a handicapped toilet? No    TIMED UP AND GO:  Was the test performed? No .    Cognitive Function: Declined today.          Screening Tests Health Maintenance  Topic Date Due  . Hepatitis C Screening  Never done  . HIV Screening  Never done  . INFLUENZA VACCINE  12/01/2019  . TETANUS/TDAP  07/12/2028    Qualifies for Shingles Vaccine? No    Tdap: Up to date  Flu Vaccine: Due fall 2021   Cancer Screenings:  Lung Cancer Screening: (Low Dose CT Chest recommended if Age 80-80 years, 30 pack-year currently smoking OR have quit w/in 15years.) does not qualify.   Additional Screening:  Hepatitis C Screening: does not qualify  Vision Screening: Recommended annual ophthalmology exams for early detection of glaucoma and other disorders of the eye.  Dental Screening: Recommended annual dental exams for proper oral hygiene  Community Resource Referral:  CRR required this visit? No      Plan:  I have personally reviewed and addressed the Medicare Annual Wellness questionnaire and have noted the following in the patient's chart:  A. Medical and social history B. Use of alcohol, tobacco or illicit drugs  C. Current medications and supplements D. Functional ability and status E.  Nutritional status F.  Physical activity G. Advance directives H. List of other physicians I.  Hospitalizations, surgeries, and ER visits in previous 12 months J.  [place] such as hearing and vision if needed, cognitive and depression L. Referrals and appointments   In addition, I have reviewed and discussed with patient certain preventive protocols, quality metrics, and best practice recommendations. A written personalized care plan for preventive services as well as general preventive health recommendations were provided to patient.   [place]ora Score, [place]   11/14/9676  Nurse Health Advisor   Nurse Notes: Pt agreed to having the HIV lab order added to BW orders at next in office apt. Declined scheduling a f/u at this time.

## 2019-10-14 ENCOUNTER — Other Ambulatory Visit: Payer: Self-pay

## 2019-10-14 ENCOUNTER — Ambulatory Visit (INDEPENDENT_AMBULATORY_CARE_PROVIDER_SITE_OTHER): Payer: Medicare HMO

## 2019-10-14 DIAGNOSIS — Z0000 Encounter for general adult medical examination without abnormal findings: Secondary | ICD-10-CM | POA: Diagnosis not present

## 2019-10-14 NOTE — Patient Instructions (Signed)
Jason Marshall , Thank you for taking time to come for your Medicare Wellness Visit. I appreciate your ongoing commitment to your health goals. Please review the following plan we discussed and let me know if I can assist you in the future.   Screening recommendations/referrals: Recommended yearly ophthalmology/optometry visit for glaucoma screening and checkup Recommended yearly dental visit for hygiene and checkup  Vaccinations: Influenza vaccine: Due fall 2021 Tdap vaccine: Up to date, due 06/2028  Advanced directives: Advance directive discussed with you today. Even though you declined this today please call our office should you change your mind and we can give you the proper paperwork for you to fill out.  Conditions/risks identified: Recommend to drink at least 6-8 8oz glasses of water per day.  Next appointment: None. Declined scheduling a follow up with PCP or an AWV for 2022 at this time.   Preventive Care 40-64 Years, Male Preventive care refers to lifestyle choices and visits with your health care provider that can promote health and wellness. What does preventive care include?  A yearly physical exam. This is also called an annual well check.  Dental exams once or twice a year.  Routine eye exams. Ask your health care provider how often you should have your eyes checked.  Personal lifestyle choices, including:  Daily care of your teeth and gums.  Regular physical activity.  Eating a healthy diet.  Avoiding tobacco and drug use.  Limiting alcohol use.  Practicing safe sex.  Taking low-dose aspirin every day starting at age 35. What happens during an annual well check? The services and screenings done by your health care provider during your annual well check will depend on your age, overall health, lifestyle risk factors, and family history of disease. Counseling  Your health care provider may ask you questions about your:  Alcohol use.  Tobacco use.  Drug  use.  Emotional well-being.  Home and relationship well-being.  Sexual activity.  Eating habits.  Work and work Statistician. Screening  You may have the following tests or measurements:  Height, weight, and BMI.  Blood pressure.  Lipid and cholesterol levels. These may be checked every 5 years, or more frequently if you are over 69 years old.  Skin check.  Lung cancer screening. You may have this screening every year starting at age 64 if you have a 30-pack-year history of smoking and currently smoke or have quit within the past 15 years.  Fecal occult blood test (FOBT) of the stool. You may have this test every year starting at age 9.  Flexible sigmoidoscopy or colonoscopy. You may have a sigmoidoscopy every 5 years or a colonoscopy every 10 years starting at age 32.  Prostate cancer screening. Recommendations will vary depending on your family history and other risks.  Hepatitis C blood test.  Hepatitis B blood test.  Sexually transmitted disease (STD) testing.  Diabetes screening. This is done by checking your blood sugar (glucose) after you have not eaten for a while (fasting). You may have this done every 1-3 years. Discuss your test results, treatment options, and if necessary, the need for more tests with your health care provider. Vaccines  Your health care provider may recommend certain vaccines, such as:  Influenza vaccine. This is recommended every year.  Tetanus, diphtheria, and acellular pertussis (Tdap, Td) vaccine. You may need a Td booster every 10 years.  Zoster vaccine. You may need this after age 94.  Pneumococcal 13-valent conjugate (PCV13) vaccine. You may need this if  you have certain conditions and have not been vaccinated.  Pneumococcal polysaccharide (PPSV23) vaccine. You may need one or two doses if you smoke cigarettes or if you have certain conditions. Talk to your health care provider about which screenings and vaccines you need and how  often you need them. This information is not intended to replace advice given to you by your health care provider. Make sure you discuss any questions you have with your health care provider. Document Released: 05/15/2015 Document Revised: 01/06/2016 Document Reviewed: 02/17/2015 Elsevier Interactive Patient Education  2017 [employer].  Fall Prevention in the Home Falls can cause injuries. They can happen to people of all ages. There are many things you can do to make your home safe and to help prevent falls. What can I do on the outside of my home?  Regularly fix the edges of walkways and driveways and fix any cracks.  Remove anything that might make you trip as you walk through a door, such as a raised step or threshold.  Trim any bushes or trees on the path to your home.  Use bright outdoor lighting.  Clear any walking paths of anything that might make someone trip, such as rocks or tools.  Regularly check to see if handrails are loose or broken. Make sure that both sides of any steps have handrails.  Any raised decks and porches should have guardrails on the edges.  Have any leaves, snow, or ice cleared regularly.  Use sand or salt on walking paths during winter.  Clean up any spills in your garage right away. This includes oil or grease spills. What can I do in the bathroom?  Use night lights.  Install grab bars by the toilet and in the tub and shower. Do not use towel bars as grab bars.  Use non-skid mats or decals in the tub or shower.  If you need to sit down in the shower, use a plastic, non-slip stool.  Keep the floor dry. Clean up any water that spills on the floor as soon as it happens.  Remove soap buildup in the tub or shower regularly.  Attach bath mats securely with double-sided non-slip rug tape.  Do not have throw rugs and other things on the floor that can make you trip. What can I do in the bedroom?  Use night lights.  Make sure that you have a  light by your bed that is easy to reach.  Do not use any sheets or blankets that are too big for your bed. They should not hang down onto the floor.  Have a firm chair that has side arms. You can use this for support while you get dressed.  Do not have throw rugs and other things on the floor that can make you trip. What can I do in the kitchen?  Clean up any spills right away.  Avoid walking on wet floors.  Keep items that you use a lot in easy-to-reach places.  If you need to reach something above you, use a strong step stool that has a grab bar.  Keep electrical cords out of the way.  Do not use floor polish or wax that makes floors slippery. If you must use wax, use non-skid floor wax.  Do not have throw rugs and other things on the floor that can make you trip. What can I do with my stairs?  Do not leave any items on the stairs.  Make sure that there are handrails on both  sides of the stairs and use them. Fix handrails that are broken or loose. Make sure that handrails are as long as the stairways.  Check any carpeting to make sure that it is firmly attached to the stairs. Fix any carpet that is loose or worn.  Avoid having throw rugs at the top or bottom of the stairs. If you do have throw rugs, attach them to the floor with carpet tape.  Make sure that you have a light switch at the top of the stairs and the bottom of the stairs. If you do not have them, ask someone to add them for you. What else can I do to help prevent falls?  Wear shoes that:  Do not have high heels.  Have rubber bottoms.  Are comfortable and fit you well.  Are closed at the toe. Do not wear sandals.  If you use a stepladder:  Make sure that it is fully opened. Do not climb a closed stepladder.  Make sure that both sides of the stepladder are locked into place.  Ask someone to hold it for you, if possible.  Clearly mark and make sure that you can see:  Any grab bars or  handrails.  First and last steps.  Where the edge of each step is.  Use tools that help you move around (mobility aids) if they are needed. These include:  Canes.  Walkers.  Scooters.  Crutches.  Turn on the lights when you go into a dark area. Replace any light bulbs as soon as they burn out.  Set up your furniture so you have a clear path. Avoid moving your furniture around.  If any of your floors are uneven, fix them.  If there are any pets around you, be aware of where they are.  Review your medicines with your doctor. Some medicines can make you feel dizzy. This can increase your chance of falling. Ask your doctor what other things that you can do to help prevent falls. This information is not intended to replace advice given to you by your health care provider. Make sure you discuss any questions you have with your health care provider. Document Released: 02/12/2009 Document Revised: 09/24/2015 Document Reviewed: 05/23/2014 Elsevier Interactive Patient Education  2017 [employer].

## 2019-11-19 ENCOUNTER — Encounter: Payer: Self-pay | Admitting: Physician Assistant

## 2019-11-19 ENCOUNTER — Ambulatory Visit (INDEPENDENT_AMBULATORY_CARE_PROVIDER_SITE_OTHER): Payer: Medicare HMO | Admitting: Physician Assistant

## 2019-11-19 ENCOUNTER — Other Ambulatory Visit: Payer: Self-pay

## 2019-11-19 VITALS — BP 139/82 | HR 60 | Temp 98.5°F | Wt 129.0 lb

## 2019-11-19 DIAGNOSIS — Q057 Lumbar spina bifida without hydrocephalus: Secondary | ICD-10-CM

## 2019-11-19 DIAGNOSIS — L539 Erythematous condition, unspecified: Secondary | ICD-10-CM | POA: Diagnosis not present

## 2019-11-19 DIAGNOSIS — W57XXXA Bitten or stung by nonvenomous insect and other nonvenomous arthropods, initial encounter: Secondary | ICD-10-CM | POA: Diagnosis not present

## 2019-11-19 MED ORDER — DOXYCYCLINE HYCLATE 100 MG PO TABS: 100.0000 mg | ORAL_TABLET | Freq: Two times a day (BID) | ORAL | 0 refills | Status: AC

## 2019-11-19 NOTE — Progress Notes (Signed)
     Established patient visit   Patient: Jason Marshall   DOB: 1991-02-12   29 y.o. Male  MRN: [id number] Visit Date: 11/19/2019  Today's healthcare provider: Trey Sailors, PA-C   Chief Complaint  Patient presents with  . Tick Removal   Subjective    HPI  Tick Bite Patient reports that he pulled a tick off yesterday. He does have some swelling and redness in the area where he found the tick.  He does not have fevers, chills, nausea, vomiting, rash. He reports he is prone to cellulitis and is concerned because [place].     Medications: Outpatient Medications Prior to Visit  Medication Sig  . EPIPEN 2-PAK 0.3 MG/0.3ML SOAJ injection   . levocetirizine (XYZAL) 5 MG tablet Take 5 mg by mouth every evening.   . Azelastine HCl 0.15 % SOLN  (Patient not taking: Reported on 10/14/2019)  . doxycycline (VIBRA-TABS) 100 MG tablet Take 1 tablet (100 mg total) by mouth 2 (two) times daily. (Patient not taking: Reported on 06/24/2019)  . fluticasone (FLONASE) 50 MCG/ACT nasal spray  (Patient not taking: Reported on 10/14/2019)   No facility-administered medications prior to visit.    Review of Systems    Objective    BP 139/82 (BP Location: Right Arm)   Pulse 60   Temp 98.5 F (36.9 C)   Wt 129 lb (58.5 kg)   BMI 31.10 kg/m    Physical Exam Constitutional:      Appearance: Normal appearance.  Skin:    Findings: No rash.       Neurological:     Mental Status: He is alert and oriented to person, place, and time. Mental status is at baseline.  Psychiatric:        Mood and Affect: Mood normal.        Behavior: Behavior normal.       No results found for any visits on 11/19/19.  Assessment & Plan    1. Redness  The bite does not appear infected. Advised to observed. Doxycycline given if patient is worsening.  2. Tick bite, initial encounter  - doxycycline (VIBRA-TABS) 100 MG tablet; Take 1 tablet (100 mg  total) by mouth 2 (two) times daily for 7 days.  Dispense: 14 tablet; Refill: 0  3. Spina bifida of lumbar region without hydrocephalus (HCC)       No follow-ups on file.      ITrey Sailors, PA-C, have reviewed all documentation for this visit. The documentation on 11/20/19 for the exam, diagnosis, procedures, and orders are all accurate and complete.    Maryella Shivers  [facility] [phone] (phone) [phone] (fax)  [facility]

## 2019-11-20 NOTE — Patient Instructions (Signed)

## 2019-11-30 DIAGNOSIS — Z20822 Contact with and (suspected) exposure to covid-19: Secondary | ICD-10-CM | POA: Diagnosis not present

## 2019-12-02 DIAGNOSIS — Q054 Unspecified spina bifida with hydrocephalus: Secondary | ICD-10-CM | POA: Diagnosis not present

## 2019-12-02 DIAGNOSIS — N319 Neuromuscular dysfunction of bladder, unspecified: Secondary | ICD-10-CM | POA: Diagnosis not present

## 2020-11-24 ENCOUNTER — Encounter: Payer: Self-pay | Admitting: Podiatry

## 2020-11-24 ENCOUNTER — Other Ambulatory Visit: Payer: Self-pay

## 2020-11-24 ENCOUNTER — Ambulatory Visit (INDEPENDENT_AMBULATORY_CARE_PROVIDER_SITE_OTHER): Payer: Medicare Other | Admitting: Podiatry

## 2020-11-24 DIAGNOSIS — M21961 Unspecified acquired deformity of right lower leg: Secondary | ICD-10-CM

## 2020-11-24 DIAGNOSIS — L97512 Non-pressure chronic ulcer of other part of right foot with fat layer exposed: Secondary | ICD-10-CM

## 2020-11-24 DIAGNOSIS — Z87728 Personal history of other specified (corrected) congenital malformations of nervous system and sense organs: Secondary | ICD-10-CM | POA: Diagnosis not present

## 2020-11-24 DIAGNOSIS — L97522 Non-pressure chronic ulcer of other part of left foot with fat layer exposed: Secondary | ICD-10-CM

## 2020-11-24 DIAGNOSIS — M21962 Unspecified acquired deformity of left lower leg: Secondary | ICD-10-CM | POA: Diagnosis not present

## 2020-11-25 ENCOUNTER — Encounter: Payer: Self-pay | Admitting: Podiatry

## 2020-11-25 NOTE — Progress Notes (Signed)
Subjective:  Patient ID: Jason Marshall, male    DOB: Feb 05, 1991,  MRN: [id number]  Chief Complaint  Patient presents with   Wound Check    Bilateral wounds to both feet     30 y.o. male presents for wound care.  Patient presents with complaint of bilateral plantar foot wound with right midfoot wound and left heel wound.  Patient is primarily ambulating with supportive devices secondary to history of spina bifida and neuropathy to the lower extremity.  He is not a diabetic.  He has not been wearing any kind of bracing.  He ambulates with regular shoes he has not seen anyone else prior to seeing me.  He would like to discuss treatment options for ulceration as well as long-term management.  He has been doing local wound care and has been following up with the wound care center.  He would like to discuss the ways to correct the deformities of his feet.   Review of Systems: Negative except as noted in the HPI. Denies N/V/F/Ch.  Past Medical History:  Diagnosis Date   Neurogenic bladder    with artificial urinary sphincter   Spina bifida (HCC)    myelodysplasia/sacral agenisis/tethered cord with multiple releases    Current Outpatient Medications:    Azelastine HCl 0.15 % SOLN, , Disp: , Rfl:    doxycycline (VIBRA-TABS) 100 MG tablet, Take 1 tablet (100 mg total) by mouth 2 (two) times daily. (Patient not taking: Reported on 06/24/2019), Disp: 20 tablet, Rfl: 0   EPIPEN 2-PAK 0.3 MG/0.3ML SOAJ injection, , Disp: , Rfl:    fluticasone (FLONASE) 50 MCG/ACT nasal spray, , Disp: , Rfl:    levocetirizine (XYZAL) 5 MG tablet, Take 5 mg by mouth every evening. , Disp: , Rfl:   Social History   Tobacco Use  Smoking Status Never  Smokeless Tobacco Current   Types: Snuff    Allergies  Allergen Reactions   Meperidine Swelling    Other reaction(s): Unknown   Shrimp [Shellfish Allergy] Anaphylaxis   Piperacillin-Tazobactam In Dex Other (See Comments)    Other Reaction: AIN w/ Vanc/Zosyn  - ? which   Vancomycin Other (See Comments)    Other Reaction: AIN w/ Vanc/Zosyn - ? which   Latex     Other reaction(s): Unknown   Objective:  There were no vitals filed for this visit. There is no height or weight on file to calculate BMI. Constitutional Well developed. Well nourished.  Vascular Dorsalis pedis pulses palpable bilaterally. Posterior tibial pulses palpable bilaterally. Capillary refill normal to all digits.  No cyanosis or clubbing noted. Pedal hair growth normal.  Neurologic Normal speech. Oriented to person, place, and time. Protective sensation absent secondary to spina bifida  Dermatologic Wound Location: Right midfoot and left heel ulceration with fat layer exposed.  No cellulitis or redness noted.  No probing down to deep tissue noted.  No malodor present no purulent drainage noted Wound Base: Granular/Healthy Peri-wound: Normal Exudate: Scant/small amount Serosanguinous exudate Wound Measurements: -See below  Orthopedic: No pain to palpation either foot.   Radiographs: None Assessment:   1. History of spina bifida   2. Ulcer of right foot with fat layer exposed (HCC)   3. Chronic foot ulcer, left, with fat layer exposed (HCC)   4. Foot deformity, bilateral    Plan:  Patient was evaluated and treated and all questions answered.  Ulcer right midfoot and left heel ulceration -Debridement as below. -Dressed with triple antibiotic and a Band-Aid, DSD. -Continue  off-loading with surgical shoe.  Cavovarus foot structure bilaterally with underlying spina bifida -I explained the patient the etiology of foot structure and its relationship with the ulceration formation.  I encouraged him that he will benefit from bracing to give stability while ambulating.  This can also lead to less use of supportive device as well.  I we will schedule him to see EJ for bracing and management of cavovarus foot structure.  He may benefit from AFO versus medial bracing to give  stability.  We will also need to include offloading of the ulcer sites. -He will be scheduled to see EJ for bracing  Procedure: Excisional Debridement of Wound right midfoot Tool: Sharp chisel blade/tissue nipper Rationale: Removal of non-viable soft tissue from the wound to promote healing.  Anesthesia: none Pre-Debridement Wound Measurements: 0.3 cm x 0.2 cm x 0.3 cm  Post-Debridement Wound Measurements: 0.4 cm x 0.3 cm x 0.3 cm  Type of Debridement: Sharp Excisional Tissue Removed: Non-viable soft tissue Blood loss: Minimal (<50cc) Depth of Debridement: subcutaneous tissue. Technique: Sharp excisional debridement to bleeding, viable wound base.  Wound Progress: This is my initial evaluation I will continue to monitor the progression of the wound Site healing conversation 7 Dressing: Dry, sterile, compression dressing. Disposition: Patient tolerated procedure well. Patient to return in 1 week for follow-up.  Procedure: Excisional Debridement of Wound left heel Tool: Sharp chisel blade/tissue nipper Rationale: Removal of non-viable soft tissue from the wound to promote healing.  Anesthesia: none Pre-Debridement Wound Measurements: 0.2 cm x 0.3 cm x 0.3 cm  Post-Debridement Wound Measurements: 0.3 cm x 0.4 cm x 0.3 cm  Type of Debridement: Sharp Excisional Tissue Removed: Non-viable soft tissue Blood loss: Minimal (<50cc) Depth of Debridement: subcutaneous tissue. Technique: Sharp excisional debridement to bleeding, viable wound base.  Wound Progress: This is my initial evaluation of continue monitor the progression of the wound. Site healing conversation 7 Dressing: Dry, sterile, compression dressing. Disposition: Patient tolerated procedure well. Patient to return in 1 week for follow-up.  No follow-ups on file.

## 2020-12-16 ENCOUNTER — Other Ambulatory Visit: Payer: Medicare Other

## 2020-12-23 ENCOUNTER — Telehealth: Payer: Medicaid Other | Admitting: Podiatry

## 2020-12-23 NOTE — Telephone Encounter (Signed)
Pt left message asking for a call about his appt scheduled for next week.  I returned call and pt no longer has [employer] as of July 31. Pt only has medicaid. He was asking for an estimate and I was not sure what braces he was needing but it would be over 1000.00. Since he has medicaid I recommended for pt to go to [facility] for the braces so they can file the insurance. HE stated [facility] and it was not covered and I told pt medicaid does not cover orthotics for adults only children 18  and under. He agreed to get the rx and go to [facility]. He would like to pick up the rx in [place] office tomorrow if possible.

## 2020-12-30 ENCOUNTER — Other Ambulatory Visit: Payer: Medicaid Other

## 2021-01-05 ENCOUNTER — Ambulatory Visit: Payer: Medicare HMO | Admitting: Podiatry

## 2021-01-07 ENCOUNTER — Other Ambulatory Visit: Payer: Self-pay

## 2021-01-07 ENCOUNTER — Ambulatory Visit: Payer: Medicaid Other | Admitting: Podiatry

## 2021-01-07 DIAGNOSIS — M21961 Unspecified acquired deformity of right lower leg: Secondary | ICD-10-CM

## 2021-01-07 DIAGNOSIS — L97512 Non-pressure chronic ulcer of other part of right foot with fat layer exposed: Secondary | ICD-10-CM

## 2021-01-07 DIAGNOSIS — Z87728 Personal history of other specified (corrected) congenital malformations of nervous system and sense organs: Secondary | ICD-10-CM | POA: Diagnosis not present

## 2021-01-07 DIAGNOSIS — L97522 Non-pressure chronic ulcer of other part of left foot with fat layer exposed: Secondary | ICD-10-CM | POA: Diagnosis not present

## 2021-01-07 DIAGNOSIS — M21962 Unspecified acquired deformity of left lower leg: Secondary | ICD-10-CM

## 2021-01-13 NOTE — Progress Notes (Signed)
Subjective:  Patient ID: Jason Marshall, male    DOB: 22-Apr-1991,  MRN: [id number]  Chief Complaint  Patient presents with   Wound Check    30 y.o. male presents for wound care.  Patient presents with complaint of bilateral plantar foot wound with right midfoot wound and left heel wound.  Patient also has antalgic gait secondary to longstanding spina bifida.  He states that he is being followed at the wound care center for the wounds and will defer management to them.  He is here to discuss further treatment of bracing.  He is at [facility] clinic for which they will work on the bracing for him.   Review of Systems: Negative except as noted in the HPI. Denies N/V/F/Ch.  Past Medical History:  Diagnosis Date   Neurogenic bladder    with artificial urinary sphincter   Spina bifida (HCC)    myelodysplasia/sacral agenisis/tethered cord with multiple releases    Current Outpatient Medications:    Azelastine HCl 0.15 % SOLN, , Disp: , Rfl:    doxycycline (VIBRA-TABS) 100 MG tablet, Take 1 tablet (100 mg total) by mouth 2 (two) times daily. (Patient not taking: Reported on 06/24/2019), Disp: 20 tablet, Rfl: 0   EPIPEN 2-PAK 0.3 MG/0.3ML SOAJ injection, , Disp: , Rfl:    fluticasone (FLONASE) 50 MCG/ACT nasal spray, , Disp: , Rfl:    levocetirizine (XYZAL) 5 MG tablet, Take 5 mg by mouth every evening. , Disp: , Rfl:   Social History   Tobacco Use  Smoking Status Never  Smokeless Tobacco Current   Types: Snuff    Allergies  Allergen Reactions   Meperidine Swelling    Other reaction(s): Unknown   Shrimp [Shellfish Allergy] Anaphylaxis   Piperacillin-Tazobactam In Dex Other (See Comments)    Other Reaction: AIN w/ Vanc/Zosyn - ? which   Vancomycin Other (See Comments)    Other Reaction: AIN w/ Vanc/Zosyn - ? which   Latex     Other reaction(s): Unknown   Objective:  There were no vitals filed for this visit. There is no height or weight on file to calculate BMI. Constitutional  Well developed. Well nourished.  Vascular Dorsalis pedis pulses palpable bilaterally. Posterior tibial pulses palpable bilaterally. Capillary refill normal to all digits.  No cyanosis or clubbing noted. Pedal hair growth normal.  Neurologic Normal speech. Oriented to person, place, and time. Protective sensation absent secondary to spina bifida  Dermatologic Wound Location: Right midfoot and left heel ulceration with fat layer exposed.  No cellulitis or redness noted.  No probing down to deep tissue noted.  No malodor present no purulent drainage noted Wound Base: Granular/Healthy Peri-wound: Normal Exudate: Scant/small amount Serosanguinous exudate Wound Measurements: -See below  Antalgic gait with cavovarus foot structure noted to both foot.  History of spinal bifida.  Orthopedic: No pain to palpation either foot.   Radiographs: None Assessment:   1. History of spina bifida   2. Ulcer of right foot with fat layer exposed (HCC)   3. Chronic foot ulcer, left, with fat layer exposed (HCC)   4. Foot deformity, bilateral     Plan:  Patient was evaluated and treated and all questions answered.  Ulcer right midfoot and left heel ulceration -Debridement as below. -Dressed with triple antibiotic and a Band-Aid, DSD. -Continue off-loading with surgical shoe. -Patient is wound is being managed at a wound care center.  I will defer further management to them.  Cavovarus foot structure bilaterally with underlying spina bifida -I  explained the patient the etiology of foot structure and its relationship with the ulceration formation.  I encouraged him that he will benefit from bracing to give stability while ambulating.  This can also lead to less use of supportive device as well.  I we will schedule him to see EJ for bracing and management of cavovarus foot structure.  He may benefit from AFO versus medial bracing to give stability.  We will also need to include offloading of the ulcer  sites. -He is in the process of obtaining tracings from [facility].  If there is no improvement I have discussed to come see me right away.  Patient states understanding and will do so immediately.  No follow-ups on file.

## 2021-01-28 ENCOUNTER — Ambulatory Visit: Payer: Medicaid Other | Admitting: Family Medicine

## 2021-01-28 ENCOUNTER — Encounter: Payer: Self-pay | Admitting: Family Medicine

## 2021-01-28 ENCOUNTER — Other Ambulatory Visit: Payer: Self-pay

## 2021-01-28 VITALS — BP 118/74 | HR 60 | Temp 98.1°F | Ht <= 58 in | Wt 123.8 lb

## 2021-01-28 DIAGNOSIS — Q057 Lumbar spina bifida without hydrocephalus: Secondary | ICD-10-CM

## 2021-01-28 DIAGNOSIS — L97419 Non-pressure chronic ulcer of right heel and midfoot with unspecified severity: Secondary | ICD-10-CM

## 2021-01-28 DIAGNOSIS — L03116 Cellulitis of left lower limb: Secondary | ICD-10-CM | POA: Diagnosis not present

## 2021-01-28 DIAGNOSIS — L97422 Non-pressure chronic ulcer of left heel and midfoot with fat layer exposed: Secondary | ICD-10-CM

## 2021-01-28 MED ORDER — SULFAMETHOXAZOLE-TRIMETHOPRIM 800-160 MG PO TABS: 1.0000 | ORAL_TABLET | Freq: Two times a day (BID) | ORAL | 0 refills | Status: AC

## 2021-01-28 NOTE — Progress Notes (Signed)
Established patient visit   Patient: Jason Marshall   DOB: 1990/05/21   30 y.o. Male  MRN: [id number] Visit Date: 01/28/2021  Today's healthcare provider: Shirlee Latch, MD   Chief Complaint  Patient presents with   Wound Check   Subjective    HPI  Chronic Ulcer, L heel - Pt. states that ulcer began ~3 months ago, following with Wound Care - Reports subjective chills last night, but remained afebrile per home thermometer - Pt. reports a "shooting pain" up LLE last night; notes that he lacks sensation on bilateral heels 2/2 spina bifida - Has a history of multiple wound infections; pt. states prior wound infections responded well to Bactrim, but did not respond to Doxycycline - 9/1 X-ray negative for osteomyelitis   Medications: Outpatient Medications Prior to Visit  Medication Sig   Azelastine HCl 0.15 % SOLN    EPIPEN 2-PAK 0.3 MG/0.3ML SOAJ injection    fluticasone (FLONASE) 50 MCG/ACT nasal spray    levocetirizine (XYZAL) 5 MG tablet Take 5 mg by mouth every evening.    [DISCONTINUED] doxycycline (VIBRA-TABS) 100 MG tablet Take 1 tablet (100 mg total) by mouth 2 (two) times daily. (Patient not taking: Reported on 06/24/2019)   No facility-administered medications prior to visit.    Review of Systems  Constitutional:  Positive for chills. Negative for activity change, appetite change and fever.  HENT: Negative.    Eyes: Negative.   Respiratory:  Negative for chest tightness and shortness of breath.   Cardiovascular:  Negative for chest pain.  Musculoskeletal:  Positive for gait problem.       Patient uses crutches for ambulation from his spinal bifida   Skin:  Positive for wound.  Psychiatric/Behavioral: Negative.        Objective    BP 118/74 (BP Location: Right Arm, Patient Position: Sitting, Cuff Size: Normal)   Pulse 60   Temp 98.1 F (36.7 C) (Oral)   Ht 4\' 7"  (1.397 m)   Wt 123 lb 12.8 oz (56.2 kg)   SpO2 100%   BMI 28.77 kg/m  {Show  previous vital signs (optional):23777}   Physical Exam Constitutional:      Appearance: He is normal weight. He is not toxic-appearing.  HENT:     Head: Normocephalic and atraumatic.     Right Ear: External ear normal.     Left Ear: External ear normal.  Eyes:     Conjunctiva/sclera: Conjunctivae normal.  Cardiovascular:     Rate and Rhythm: Normal rate and regular rhythm.     Pulses: Normal pulses.     Heart sounds: Normal heart sounds.  Pulmonary:     Effort: Pulmonary effort is normal.     Breath sounds: Normal breath sounds.  Musculoskeletal:     Comments: Patient uses crutches for ambulation from his spinal bifida   Skin:    Findings: Lesion present.     Comments: Chronic circular ~ 1 cm ulcer on R plantar heel - clean, non-exudative with edges well-defined and callused; moderate warmth noted on L ankle & calf Chronic circular ~3 cm ulcer on lateral L plantar heel - foul-smelling, with moderate serous exudate, fat layer exposed, and edges well-defined and callused   Neurological:     Mental Status: He is alert.     No results found for any visits on 01/28/21.  Assessment & Plan     Problem List Items Addressed This Visit       Nervous and Auditory  Spina bifida (HCC)    - Chronic, stable      Other Visit Diagnoses     Cellulitis of left lower extremity    -  Primary   - Acute, uncomplicated cellulitis of LLE - Start Bactrim x 7 days - Continue following with wound care; continue keeping foot wounds clean and dry - Return to clinic if symptoms worsen/persist or fever arises    Ulcer of left heel and midfoot with fat layer exposed (HCC)       - Chronic neuropathic ulcer of L heel - Likely source of acute LLE cellulitis; start Bactrim - Continue following with wound care, due for follow-up next week - Continue keeping foot wounds clean and dry - Return to clinic if symptoms worsen/persist or fever arises    Ulcer of right heel and midfoot, unspecified ulcer  stage (HCC)       - Chronic neuropathic ulcer of R heel - Continue following with wound care; continue keeping foot wounds clean and dry - Return to clinic if symptoms worsen/persist or fever arises         Return if symptoms worsen or fail to improve.        Queen Blossom, MS3   Patient seen along with MS3 student Queen Blossom. I personally evaluated this patient along with the student, and verified all aspects of the history, physical exam, and medical decision making as documented by the student. I agree with the student's documentation and have made all necessary edits.  Georgann Bramble, Marzella Schlein, MD, MPH [facility]

## 2021-01-28 NOTE — Assessment & Plan Note (Addendum)
Chronic, stable 

## 2021-02-22 ENCOUNTER — Inpatient Hospital Stay: Payer: Medicaid Other | Admitting: Physician Assistant

## 2021-04-08 ENCOUNTER — Ambulatory Visit: Payer: Medicaid Other | Admitting: Podiatry

## 2021-09-01 ENCOUNTER — Other Ambulatory Visit: Payer: Self-pay | Admitting: Medical

## 2021-09-01 DIAGNOSIS — L89624 Pressure ulcer of left heel, stage 4: Secondary | ICD-10-CM

## 2021-09-13 ENCOUNTER — Ambulatory Visit
Admission: RE | Admit: 2021-09-13 | Discharge: 2021-09-13 | Disposition: A | Discharge status: Home / Self Care | Payer: Medicaid Other | Source: Ambulatory Visit | Attending: Medical | Admitting: Medical

## 2021-09-13 ENCOUNTER — Ambulatory Visit: Payer: Medicare Other | Admitting: Physician Assistant

## 2021-09-13 DIAGNOSIS — L89624 Pressure ulcer of left heel, stage 4: Secondary | ICD-10-CM | POA: Diagnosis present

## 2021-09-13 MED ORDER — GADOBUTROL 1 MMOL/ML IV SOLN
5.0000 mL | Freq: Once | INTRAVENOUS | Status: AC | PRN
  Administered 2021-09-13: 5 mL via INTRAVENOUS

## 2023-03-19 ENCOUNTER — Ambulatory Visit
Admission: EM | Admit: 2023-03-19 | Discharge: 2023-03-19 | Disposition: A | Discharge status: Home / Self Care | Payer: Medicaid Other

## 2023-06-27 IMAGING — MR MR FOOT*L* WO/W CM
7 of 9 series · 31 of 40 positions shown · IV contrast (5ml Gadavist)
Comparison: Left foot radiographs 06/24/2019

CLINICAL DATA: Left foot heel ulcer. Excisional debridement
11/24/2020.

EXAM:
MRI OF THE LEFT FOOT WITHOUT AND WITH CONTRAST
TECHNIQUE: Multiplanar, multisequence MR imaging of the left hindfoot was
performed both before and after administration of intravenous
contrast.
CONTRAST:  5mL GADAVIST GADOBUTROL 1 MMOL/ML IV SOLN

[Series 4: PD fat-sat · axial · left · 3.0mm · 0.50mm/px · z∈[-86,+61]mm · 6 of 38 slices shown]
[im 1/38]
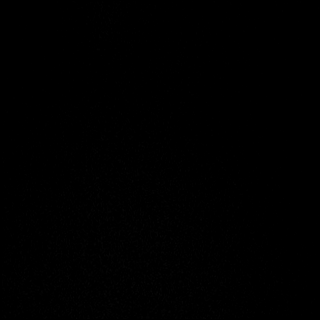
[im 8/38]
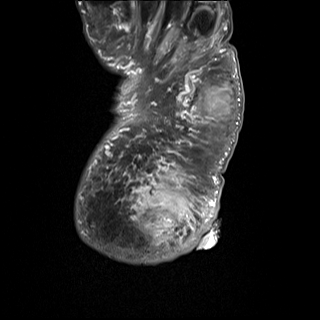
[im 15/38]
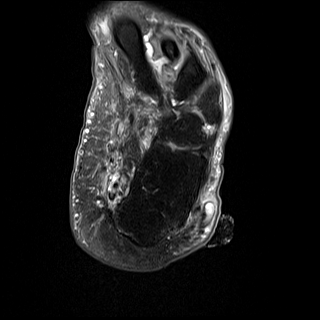
[im 23/38]
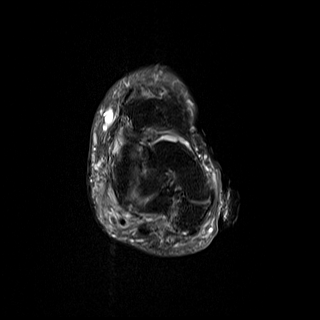
[im 30/38]
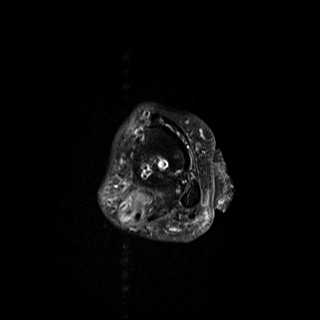
[im 38/38]
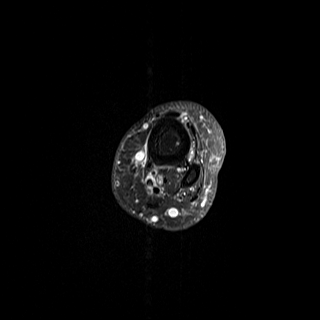

[Series 5: T2 fat-sat · axial · left · 3.0mm · 0.50mm/px · z∈[-86,+61]mm · 5 of 38 slices shown]
[im 1/38]
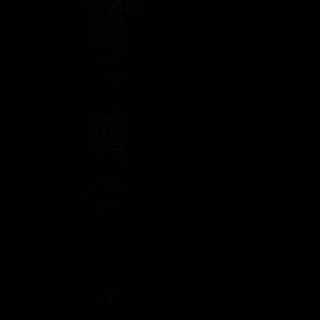
[im 10/38]
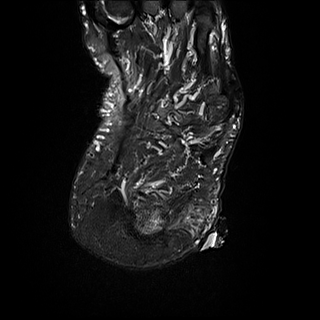
[im 19/38]
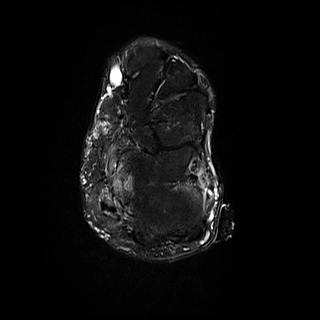
[im 28/38]
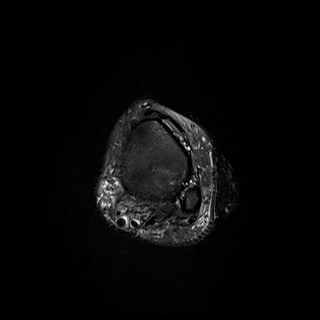
[im 38/38]
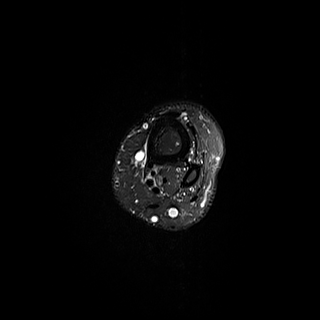

[Series 8: T1 · sagittal · left · 4.0mm · 0.70mm/px · 3 of 24 slices shown]
[im 1/24]
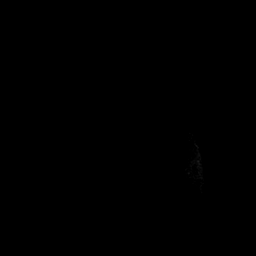
[im 12/24]
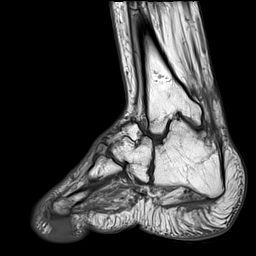
[im 24/24]
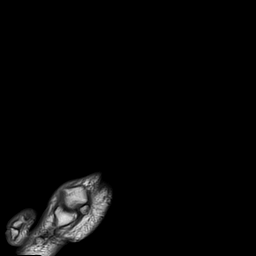

[Series 10: T1 fat-sat · axial · non-contrast · left · 3.0mm · 0.31mm/px · z∈[-86,+61]mm · 5 of 38 slices shown]
[im 1/38]
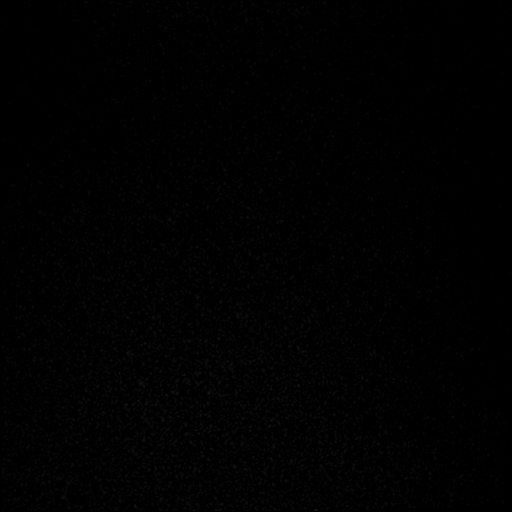
[im 10/38]
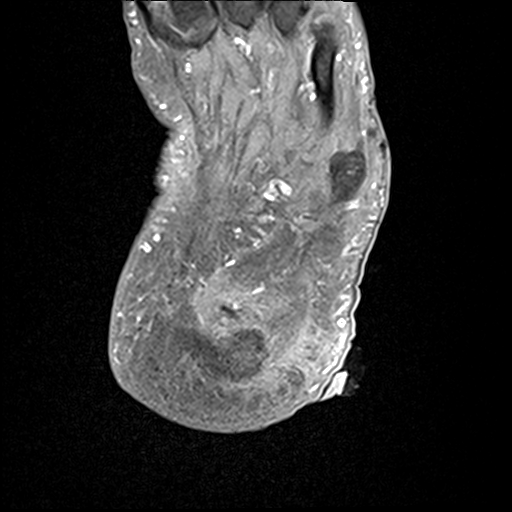
[im 19/38]
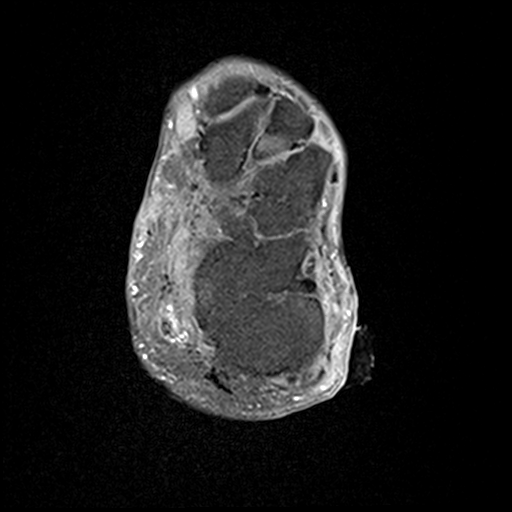
[im 28/38]
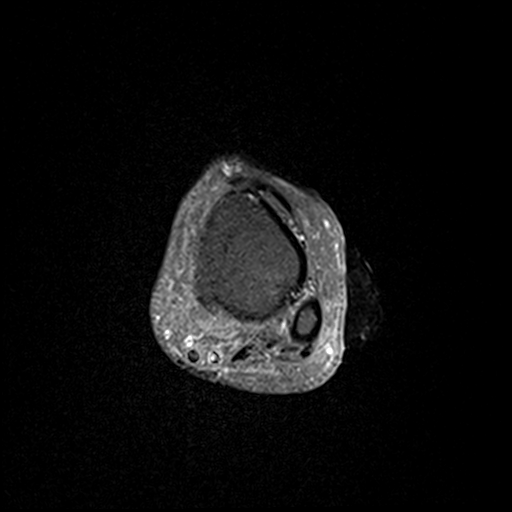
[im 38/38]
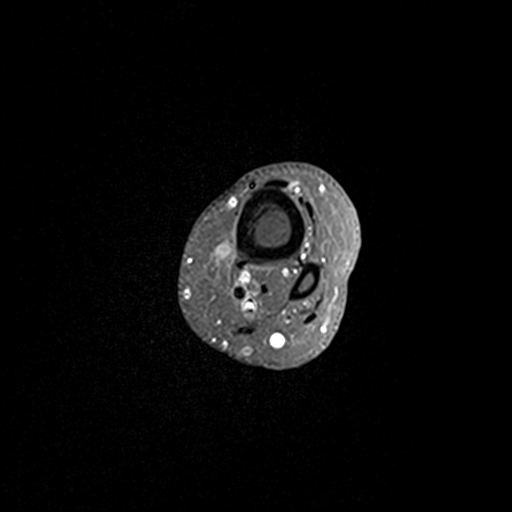

[Series 11: T1 fat-sat post-contrast · axial · left · 3.0mm · 0.31mm/px · z∈[-86,+61]mm · 5 of 38 slices shown (1 of 3)]
[im 1/38]
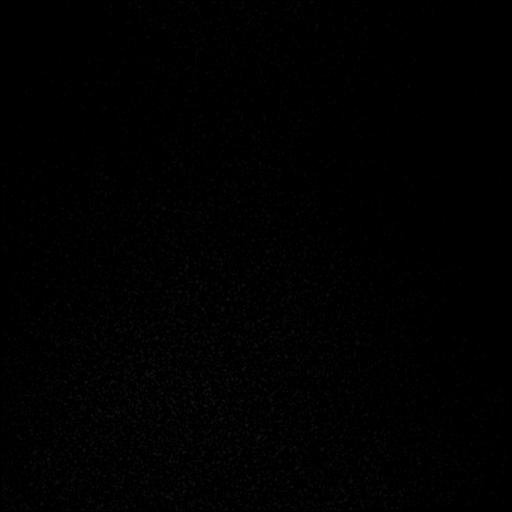
[im 10/38]
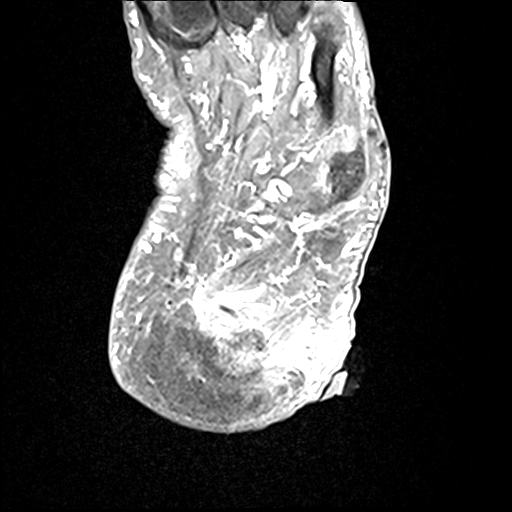
[im 19/38]
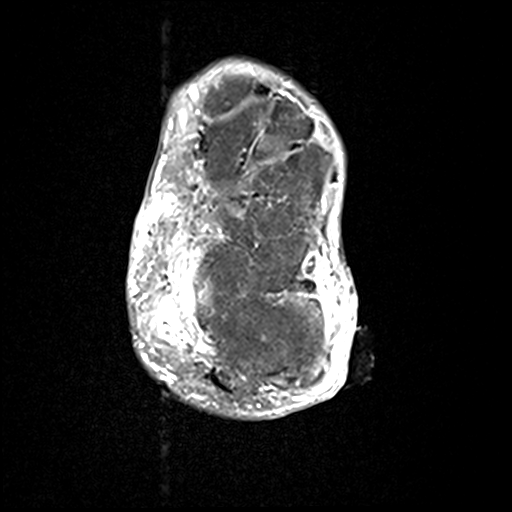
[im 28/38]
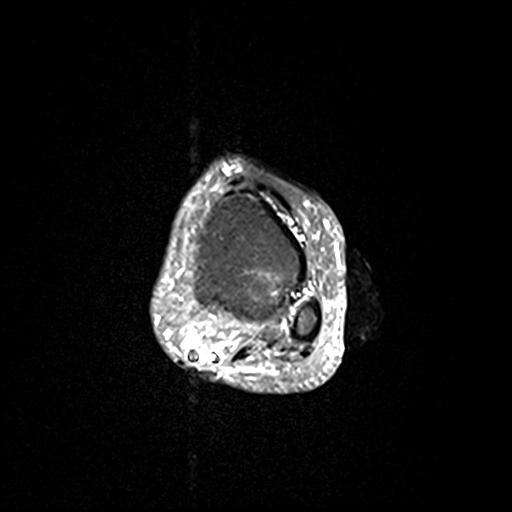
[im 38/38]
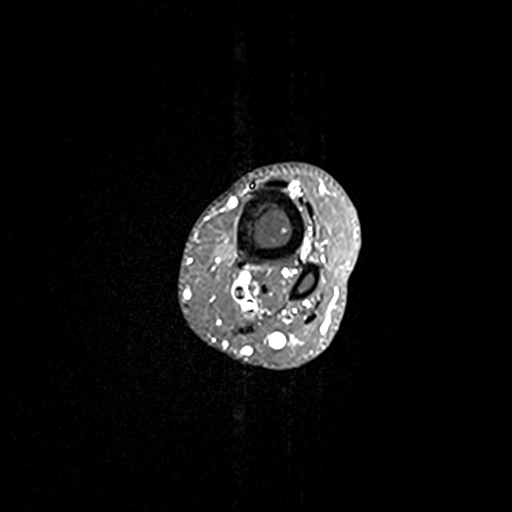

[Series 12: T1 fat-sat post-contrast · coronal · left · 3.0mm · 0.62mm/px · 5 of 40 slices shown (2 of 3)]
[im 1/40]
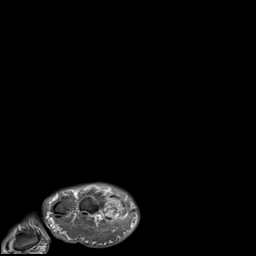
[im 10/40]
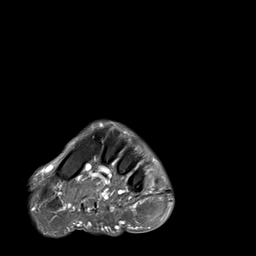
[im 20/40]
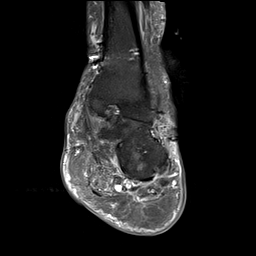
[im 30/40]
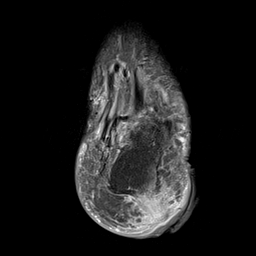
[im 40/40]
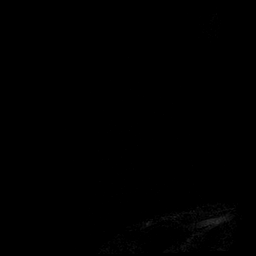

[Series 13: T1 fat-sat post-contrast · sagittal · left · 4.0mm · 0.70mm/px · 2 of 24 slices shown (3 of 3)]
[im 1/24]
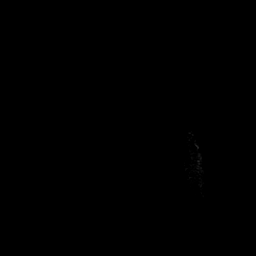
[im 12/24]
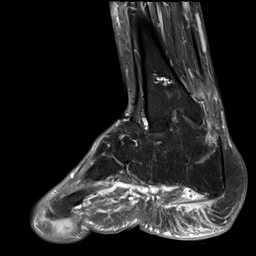

[31 of 40 positions shown; findings below may reference images not displayed]

FINDINGS: Bones/Joint/Cartilage

Status post amputation through the base of the 5th metatarsal. There
are postsurgical changes in the hindfoot from previous subtalar
arthrodesis. The distal fibula appears ankylosed to the calcaneus.
Postsurgical changes are present in the distal tibia, and there are
advanced tibiotalar degenerative changes. Underlying Galileo Auerbach
deformity.

There is deep soft tissue ulceration along the plantar aspect of the
heel with underlying changes in the subcutaneous fat measuring up to
3.3 cm on the T1 weighted images. There is associated heterogeneous
T2 signal and surrounding enhancement which extends to the plantar
aspect of the calcaneal tuberosity. There is underlying subcortical
marrow T2 hyperintensity and enhancement in the plantar aspect of
the calcaneal tuberosity with probable cortical thinning on the T1
weighted images, highly suspicious for osteomyelitis.

The toes are incompletely visualized. The sagittal and short axis
axial images demonstrate abnormal signal and enhancement within the
base of the 4th proximal phalanx which may reflect a fracture.

Ligaments

Intact Lisfranc ligament.

Muscles and Tendons

No acute tendon abnormalities are identified. There is no
significant tenosynovitis. There is laxity of the medial flexor
tendons at the level of the tibiotalar joint which is likely related
to the patient's hindfoot deformity or previous surgery. Generalized
muscular atrophy without focal fluid collection or suspicious
enhancement.

Soft tissues

As above, deep soft tissue ulceration along the plantar aspect of
the heel with surrounding soft tissue enhancement extending to the
cortex of the calcaneal tuberosity. No drainable fluid collection in
this area. Additional probable pressure lesions plantar to the
remaining 5th metatarsal and 4th metatarsal head. There is mildly
heterogeneous enhancement within the subcutaneous fat plantar to the
4th metatarsal head, although this is not in direct contact with the
described abnormality of the 4th proximal phalanx above.
IMPRESSION: 1. Deep soft tissue ulceration along the plantar aspect of the heel
with surrounding soft tissue enhancement extending to bone.
Underlying marrow T2 hyperintensity and enhancement within the
plantar aspect of the calcaneal tuberosity, highly suspicious for
osteomyelitis.
2. Additional soft tissue changes along the plantar aspect of the
4th metatarsal head which may reflect a pressure lesion. Correlate
for skin ulceration in this area. No drainable fluid collections
identified.
3. Possible fracture of the 4th proximal phalangeal base,
incompletely visualized. Recommend plain film correlation.
4. Congenital and postsurgical deformities of the ankle and
hindfoot.

## 2023-11-06 ENCOUNTER — Telehealth: Admitting: Physician Assistant

## 2023-11-06 DIAGNOSIS — L0390 Cellulitis, unspecified: Secondary | ICD-10-CM | POA: Diagnosis not present

## 2023-11-06 DIAGNOSIS — W57XXXA Bitten or stung by nonvenomous insect and other nonvenomous arthropods, initial encounter: Secondary | ICD-10-CM

## 2023-11-06 DIAGNOSIS — S30861A Insect bite (nonvenomous) of abdominal wall, initial encounter: Secondary | ICD-10-CM | POA: Diagnosis not present

## 2023-11-06 MED ORDER — DOXYCYCLINE HYCLATE 100 MG PO CAPS: 100.0000 mg | ORAL_CAPSULE | Freq: Two times a day (BID) | ORAL | 0 refills | Status: AC

## 2023-11-06 NOTE — Progress Notes (Signed)
 I have spent 5 minutes in review of e-visit questionnaire, review and updating patient chart, medical decision making and response to patient.   Laure Kidney, PA-C

## 2023-11-06 NOTE — Progress Notes (Signed)
 E-Visit for Tick Bite  Thank you for describing your tick bite, Here is how we plan to help! Based on the information that you shared with me it looks like you have A tick that bite that we will treat with a short course of doxycycline .  In most cases a tick bite is painless and does not itch.  Most tick bites in which the tick is quickly removed do not require prescriptions. Ticks can transmit several diseases if they are infected and remain attacked to your skin. Therefore the length that the tick was attached and any symptoms you have experienced after the bite are import to accurately develop your custom treatment plan. In most cases a single dose of doxycycline  may prevent the development of a more serious condition.  Based on your information I have Provided a home care guide for tick bites and  instructions on when to call for help. I have sent doxycycline  twice daily for 7 days to your pharmacy. You will need to schedule a follow up with your PCP.   Which ticks  are associated with illness?  The Wood Tick (dog tick) is the size of a watermelon seed and can sometimes transmit [facility] spotted fever and [place]  tick fever.   The Deer Tick (black-legged tick) is between the size of a poppy seed (pin head) and an apple seed, and can sometimes transmit Lyme disease.  A brown to black tick with a white splotch on its back is likely a male Amblyomma americanum ([place] tick). This tick has been associated with Southern Tick Associated illness ( STARI)  Lyme disease has become the most common tick-borne illness in the [place] . The risk of Lyme disease following a recognized deer tick bite is estimated to be 1%.  The majority of cases of Lyme disease start with a bull's eye rash at the site of the tick bite. The rash can occur days to weeks (typically 7-10 days) after a tick bite. Treatment with antibiotics is indicated if this rash appears. Flu-like symptoms may accompany the  rash, including: fever, chills, headaches, muscle aches, and fatigue. Removing ticks promptly may prevent tick borne disease.  What can be used to prevent Tick Bites?  Insect repellant with at leas 20% DEET. Wearing long pants with sock and shoes. Avoiding tall grass and heavily wooded areas. Checking your skin after being outdoors. Shower with a washcloth after outdoor exposures.  HOME CARE ADVICE FOR TICK BITE  Wood Tick Removal:  Use a pair of tweezers and grasp the wood tick close to the skin (on its head). Pull the wood tick straight upward without twisting or crushing it. Maintain a steady pressure until it releases its grip.   If tweezers aren't available, use fingers, a loop of thread around the jaws, or a needle between the jaws for traction.  Note: covering the tick with petroleum jelly, nail polish or rubbing alcohol doesn't work. Neither does touching the tick with a hot or cold object. Tiny Deer Tick Removal:   Needs to be scraped off with a knife blade or credit card edge. Place tick in a sealed container (e.g. glass jar, zip lock plastic bag), in case your doctor wants to see it. Tick's Head Removal:  If the wood tick's head breaks off in the skin, it must be removed. Clean the skin. Then use a sterile needle to uncover the head and lift it out or scrape it off.  If a very small piece of the  head remains, the skin will eventually slough it off. Antibiotic Ointment:  Wash the wound and your hands with soap and water after removal to prevent catching any tick disease.  Apply an over the counter antibiotic ointment (e.g. bacitracin) to the bite once. Expected Course: Tick bites normally don't itch or hurt. That's why they often go unnoticed. Call Your Doctor If:  You can't remove the tick or the tick's head Fever, a severe head ache, or rash occur in the next 2 weeks Bite begins to look infected Lyme's disease is common in your area You have not had a tetanus in the last 10  years Your current symptoms become worse    MAKE SURE YOU  Understand these instructions. Will watch your condition. Will get help right away if you are not doing well or get worse.    Thank you for choosing an e-visit.  Your e-visit answers were reviewed by a board certified advanced clinical practitioner to complete your personal care plan. Depending upon the condition, your plan could have included both over the counter or prescription medications.  Please review your pharmacy choice. Make sure the pharmacy is open so you can pick up prescription now. If there is a problem, you may contact your provider through [employer] and have the prescription routed to another pharmacy.  Your safety is important to us . If you have drug allergies check your prescription carefully.   For the next 24 hours you can use MyChart to ask questions about today's visit, request a non-urgent call back, or ask for a work or school excuse. You will get an email in the next two days asking about your experience. I hope that your e-visit has been valuable and will speed your recovery.

## 2024-03-23 ENCOUNTER — Telehealth: Admitting: Physician Assistant

## 2024-03-23 DIAGNOSIS — R300 Dysuria: Secondary | ICD-10-CM

## 2024-03-23 NOTE — Progress Notes (Signed)
  Because of your symptoms and history, I feel your condition warrants further evaluation and I recommend that you be seen in a face-to-face visit.   NOTE: There will be NO CHARGE for this E-Visit   If you are having a true medical emergency, please call 911.     For an urgent face to face visit, [place] has multiple urgent care centers for your convenience.  Click the link below for the full list of locations and hours, walk-in wait times, appointment scheduling options and driving directions:  Urgent Care - [place], [place], [place], [place], [place], [place]  [place]     Your MyChart E-visit questionnaire answers were reviewed by a board certified advanced clinical practitioner to complete your personal care plan based on your specific symptoms.    Thank you for using e-Visits.
